# Patient Record
Sex: Female | Born: 1975 | Race: White | Hispanic: No | Marital: Married | Smoking: Current every day smoker
Health system: Southern US, Community
[De-identification: ages and names within clinical notes are randomized; demographics above are authoritative.]

## PROBLEM LIST (undated history)

## (undated) DIAGNOSIS — I639 Cerebral infarction, unspecified: Secondary | ICD-10-CM

## (undated) DIAGNOSIS — I10 Essential (primary) hypertension: Secondary | ICD-10-CM

## (undated) DIAGNOSIS — N809 Endometriosis, unspecified: Secondary | ICD-10-CM

## (undated) DIAGNOSIS — N83209 Unspecified ovarian cyst, unspecified side: Secondary | ICD-10-CM

## (undated) HISTORY — PX: TONSILLECTOMY: SUR1361

## (undated) HISTORY — PX: CHOLECYSTECTOMY: SHX55

---

## 2007-12-21 DIAGNOSIS — I639 Cerebral infarction, unspecified: Secondary | ICD-10-CM

## 2007-12-21 HISTORY — DX: Cerebral infarction, unspecified: I63.9

## 2008-12-20 HISTORY — PX: TUBAL LIGATION: SHX77

## 2013-12-20 HISTORY — PX: BREAST CYST ASPIRATION: SHX578

## 2021-09-06 ENCOUNTER — Other Ambulatory Visit: Payer: Self-pay

## 2021-09-06 DIAGNOSIS — N644 Mastodynia: Secondary | ICD-10-CM | POA: Insufficient documentation

## 2021-09-06 DIAGNOSIS — Z5321 Procedure and treatment not carried out due to patient leaving prior to being seen by health care provider: Secondary | ICD-10-CM | POA: Insufficient documentation

## 2021-09-06 LAB — CBC
HCT: 41.6 % (ref 36.0–46.0)
Hemoglobin: 14.5 g/dL (ref 12.0–15.0)
MCH: 31.2 pg (ref 26.0–34.0)
MCHC: 34.9 g/dL (ref 30.0–36.0)
MCV: 89.5 fL (ref 80.0–100.0)
Platelets: 291 10*3/uL (ref 150–400)
RBC: 4.65 MIL/uL (ref 3.87–5.11)
RDW: 12.1 % (ref 11.5–15.5)
WBC: 8.9 10*3/uL (ref 4.0–10.5)
nRBC: 0 % (ref 0.0–0.2)

## 2021-09-06 LAB — COMPREHENSIVE METABOLIC PANEL
ALT: 16 U/L (ref 0–44)
AST: 16 U/L (ref 15–41)
Albumin: 4.1 g/dL (ref 3.5–5.0)
Alkaline Phosphatase: 39 U/L (ref 38–126)
Anion gap: 8 (ref 5–15)
BUN: 13 mg/dL (ref 6–20)
CO2: 25 mmol/L (ref 22–32)
Calcium: 9 mg/dL (ref 8.9–10.3)
Chloride: 104 mmol/L (ref 98–111)
Creatinine, Ser: 0.73 mg/dL (ref 0.44–1.00)
GFR, Estimated: >60 mL/min (ref >60)
Glucose, Bld: 95 mg/dL (ref 70–99)
Potassium: 3.3 mmol/L — ABNORMAL LOW (ref 3.5–5.1)
Sodium: 137 mmol/L (ref 135–145)
Total Bilirubin: 0.6 mg/dL (ref 0.3–1.2)
Total Protein: 6.7 g/dL (ref 6.5–8.1)

## 2021-09-06 LAB — LIPASE, BLOOD: Lipase: 37 U/L (ref 11–51)

## 2021-09-06 NOTE — ED Triage Notes (Signed)
Per pt has been having right sided chest pain upper abd pain around to ribs "for awhile now". Pt states has been nauseated, but denies fever. Pt states has been shob, but also states has asthma. Pt appears in no acute distress, has no gallbladder.

## 2021-09-06 NOTE — ED Triage Notes (Signed)
First RN Note: Pt to ED via [facility] with c/o R side pain that starts under R breast. Per EMS EKG unremarkable, VSS WNL.   74HR 99% RA 125/76

## 2021-09-07 ENCOUNTER — Emergency Department: Payer: Self-pay

## 2021-09-07 ENCOUNTER — Emergency Department
Admission: EM | Admit: 2021-09-07 | Discharge: 2021-09-07 | Disposition: A | Discharge status: Home / Self Care | Payer: Self-pay | Attending: Emergency Medicine | Admitting: Emergency Medicine

## 2021-09-07 ENCOUNTER — Emergency Department
Admission: EM | Admit: 2021-09-07 | Discharge: 2021-09-07 | Disposition: A | Discharge status: Home / Self Care | Payer: Self-pay | Attending: Student in an Organized Health Care Education/Training Program | Admitting: Student in an Organized Health Care Education/Training Program

## 2021-09-07 DIAGNOSIS — M25569 Pain in unspecified knee: Secondary | ICD-10-CM | POA: Insufficient documentation

## 2021-09-07 DIAGNOSIS — Z91010 Allergy to peanuts: Secondary | ICD-10-CM | POA: Diagnosis not present

## 2021-09-07 DIAGNOSIS — R0789 Other chest pain: Secondary | ICD-10-CM | POA: Insufficient documentation

## 2021-09-07 DIAGNOSIS — R110 Nausea: Secondary | ICD-10-CM | POA: Insufficient documentation

## 2021-09-07 DIAGNOSIS — R1011 Right upper quadrant pain: Secondary | ICD-10-CM | POA: Insufficient documentation

## 2021-09-07 HISTORY — DX: Cerebral infarction, unspecified: I63.9

## 2021-09-07 LAB — CBC
HCT: 43.3 % (ref 36.0–46.0)
Hemoglobin: 15.3 g/dL — ABNORMAL HIGH (ref 12.0–15.0)
MCH: 31.9 pg (ref 26.0–34.0)
MCHC: 35.3 g/dL (ref 30.0–36.0)
MCV: 90.2 fL (ref 80.0–100.0)
Platelets: 285 10*3/uL (ref 150–400)
RBC: 4.8 MIL/uL (ref 3.87–5.11)
RDW: 12.2 % (ref 11.5–15.5)
WBC: 7.6 10*3/uL (ref 4.0–10.5)
nRBC: 0 % (ref 0.0–0.2)

## 2021-09-07 LAB — LIPASE, BLOOD: Lipase: 36 U/L (ref 11–51)

## 2021-09-07 LAB — COMPREHENSIVE METABOLIC PANEL
ALT: 17 U/L (ref 0–44)
AST: 17 U/L (ref 15–41)
Albumin: 4.1 g/dL (ref 3.5–5.0)
Alkaline Phosphatase: 35 U/L — ABNORMAL LOW (ref 38–126)
Anion gap: 7 (ref 5–15)
BUN: 12 mg/dL (ref 6–20)
CO2: 24 mmol/L (ref 22–32)
Calcium: 9.3 mg/dL (ref 8.9–10.3)
Chloride: 106 mmol/L (ref 98–111)
Creatinine, Ser: 0.74 mg/dL (ref 0.44–1.00)
GFR, Estimated: >60 mL/min (ref >60)
Glucose, Bld: 86 mg/dL (ref 70–99)
Potassium: 3.8 mmol/L (ref 3.5–5.1)
Sodium: 137 mmol/L (ref 135–145)
Total Bilirubin: 0.8 mg/dL (ref 0.3–1.2)
Total Protein: 6.9 g/dL (ref 6.5–8.1)

## 2021-09-07 MED ORDER — ACETAMINOPHEN 500 MG PO TABS
1000.0000 mg | ORAL_TABLET | Freq: Once | ORAL | Status: AC
  Administered 2021-09-07: 1000 mg via ORAL
  Filled 2021-09-07: qty 2

## 2021-09-07 MED ORDER — KETOROLAC TROMETHAMINE 30 MG/ML IJ SOLN
30.0000 mg | Freq: Once | INTRAMUSCULAR | Status: AC
  Administered 2021-09-07: 30 mg via INTRAMUSCULAR
  Filled 2021-09-07: qty 1

## 2021-09-07 MED ORDER — LIDOCAINE 5 % EX PTCH: 1.0000 | MEDICATED_PATCH | Freq: Two times a day (BID) | CUTANEOUS | 0 refills | Status: DC

## 2021-09-07 MED ORDER — LIDOCAINE 5 % EX PTCH
1.0000 | MEDICATED_PATCH | Freq: Once | CUTANEOUS | Status: DC
  Administered 2021-09-07: 1 via TRANSDERMAL
  Filled 2021-09-07: qty 1

## 2021-09-07 NOTE — ED Provider Notes (Signed)
[facility] Emergency Department Provider Note ____________________________________________   Event Date/Time   First MD Initiated Contact with Patient 09/07/21 1200     (approximate)  I have reviewed the triage vital signs and the nursing notes.  HISTORY  Chief Complaint Abdominal Pain and Knee Pain   HPI Brandi Ford is a 45 y.o. femalewho presents to the ED for evaluation of RUQ abdominal/right chest pain.  Chart review indicates obese patient.  She reports smoking history.  She is status post cholecystectomy.  She presents to the ED for the ration of right basilar chest/RUQ pain that has been progressively worsened over the past couple days.  Has not taken any medications prior to arrival.  Denies shortness of breath, cough, substernal chest pain, emesis, diarrhea.  Does report some associated nausea.  Denies dysuria.   Reports some associated nausea.   Past Medical History:  Diagnosis Date   Stroke [facility])     There are no problems to display for this patient.   Past Surgical History:  Procedure Laterality Date   CHOLECYSTECTOMY      Prior to Admission medications   Medication Sig Start Date End Date Taking? Authorizing Provider  lidocaine (LIDODERM) 5 % Place 1 patch onto the skin every 12 (twelve) hours. Remove & Discard patch within 12 hours or as directed by MD 09/07/21 09/07/22 Yes Vladimir Crofts, MD    Allergies Aspirin, Claritin [loratadine], Dilaudid [hydromorphone], Norco [hydrocodone-acetaminophen], and Peanut (diagnostic)  No family history on file.  Social History Social History   Substance Use Topics   Alcohol use: Not Currently   Drug use: Not Currently    Review of Systems  Constitutional: No fever/chills Eyes: No visual changes. ENT: No sore throat. Cardiovascular: Positive right basilar chest pain, atraumatic. Respiratory: Denies shortness of breath. Gastrointestinal: No abdominal pain.   no vomiting.  No  diarrhea.  No constipation. Positive for nausea Genitourinary: Negative for dysuria. Musculoskeletal: Negative for back pain. Skin: Negative for rash. Neurological: Negative for headaches, focal weakness or numbness.   ____________________________________________   PHYSICAL EXAM:  VITAL SIGNS: Vitals:   09/07/21 1145  BP: (!) 126/47  Pulse: 70  Resp: 16  Temp: 98.4 F (36.9 C)  SpO2: 96%    Constitutional: Alert and oriented. Well appearing and in no acute distress. Eyes: Conjunctivae are normal. PERRL. EOMI. Head: Atraumatic. Nose: No congestion/rhinnorhea. Mouth/Throat: Mucous membranes are moist.  Oropharynx non-erythematous. Neck: No stridor. No cervical spine tenderness to palpation. Cardiovascular: Normal rate, regular rhythm. Grossly normal heart sounds.  Good peripheral circulation. Respiratory: Normal respiratory effort.  No retractions. Lungs CTAB. Gastrointestinal: Soft , nondistended, nontender to palpation. No CVA tenderness. Benign abdomen including RUQ and epigastrium. Musculoskeletal: No lower extremity tenderness nor edema.  No joint effusions. No signs of acute trauma. Most tender to the inferior portion of the right anterior and lateral chest wall and rib cage.  No overlying skin changes. Neurologic:  Normal speech and language. No gross focal neurologic deficits are appreciated. No gait instability noted. Skin:  Skin is warm, dry and intact. No rash noted. Psychiatric: Mood and affect are normal. Speech and behavior are normal. ____________________________________________   LABS (all labs ordered are listed, but only abnormal results are displayed)  Labs Reviewed  COMPREHENSIVE METABOLIC PANEL - Abnormal; Notable for the following components:      Result Value   Alkaline Phosphatase 35 (*)    All other components within normal limits  CBC - Abnormal; Notable for the following components:  Hemoglobin 15.3 (*)    All other components within normal  limits  LIPASE, BLOOD  URINALYSIS, COMPLETE (UACMP) WITH MICROSCOPIC   ____________________________________________  12 Lead EKG  Sinus rhythm with a rate of 66 bpm.  Rightward axis, normal intervals.  No evidence of acute ischemia. ____________________________________________  RADIOLOGY  ED MD interpretation: 2 view CXR reviewed by me without evidence of acute cardiopulmonary pathology.  Official radiology report(s): DG Chest 2 View  Result Date: 09/07/2021 CLINICAL DATA:  RIGHT basilar chest pain EXAM: CHEST - 2 VIEW COMPARISON:  None. FINDINGS: Normal mediastinum and cardiac silhouette. Normal pulmonary vasculature. No evidence of effusion, infiltrate, or pneumothorax. No acute bony abnormality. IMPRESSION: No acute cardiopulmonary process. Electronically Signed   By: Suzy Bouchard M.D.   On: 09/07/2021 13:11    ____________________________________________   PROCEDURES and INTERVENTIONS  Procedure(s) performed (including Critical Care):  Procedures  Medications  lidocaine (LIDODERM) 5 % 1 patch (1 patch Transdermal Patch Applied 09/07/21 1224)  ketorolac (TORADOL) 30 MG/ML injection 30 mg (30 mg Intramuscular Given 09/07/21 1224)  acetaminophen (TYLENOL) tablet 1,000 mg (1,000 mg Oral Given 09/07/21 1223)    ____________________________________________   MDM / ED COURSE   45 year old female presents to the ED with right-sided pain, likely MSK chest wall pain, and amenable to outpatient management.  Triaged as abdominal pain, but her tenderness is really to her right inferior lateral rib cage.  No abdominal tenderness and her abdomen is soft and benign beneath this.  CXR without infiltrate or PTX.  She is already s/p cholecystectomy.  Resolving symptoms after Toradol and lidocaine patch.  We will discharge with return precautions.  Clinical Course as of 09/07/21 1333  Mon Sep 07, 2021  1212 Discussed plan of care with pt and husband. In agreement [DS]  1331 Reassessed.   Resolving pain.  We discussed return precautions for the ED.  Requesting a work note [DS]    Clinical Course User Index [DS] Vladimir Crofts, MD    ____________________________________________   FINAL CLINICAL IMPRESSION(S) / ED DIAGNOSES  Final diagnoses:  Other chest pain  Chest wall pain     ED Discharge Orders          Ordered    lidocaine (LIDODERM) 5 %  Every 12 hours        09/07/21 1331             Brandi Ford   Note:  This document was prepared using Systems analyst and may include unintentional dictation errors.    Vladimir Crofts, MD 09/07/21 1335

## 2021-09-07 NOTE — ED Triage Notes (Signed)
Pt to ED for RUQ pain that started a few days ago. +nausea.  Also reports left knee pain that started today while standing.  LWBS yesterday

## 2021-09-07 NOTE — Discharge Instructions (Signed)
Please take Tylenol and ibuprofen/Advil for your pain.  It is safe to take them together, or to alternate them every few hours.  Take up to 1000mg  of Tylenol at a time, up to 4 times per day.  Do not take more than 4000 mg of Tylenol in 24 hours.  For ibuprofen, take 400-600 mg, 4-5 times per day.  Please use lidocaine patches and your site of pain.  Apply 1 patch at a time, leave on for 12 hours, then remove for 12 hours.  12 hours on, 12 hours off.  Do not apply more than 1 patch at a time.

## 2022-02-09 ENCOUNTER — Other Ambulatory Visit: Payer: Self-pay

## 2022-02-09 ENCOUNTER — Ambulatory Visit
Admission: RE | Admit: 2022-02-09 | Discharge: 2022-02-09 | Disposition: A | Discharge status: Home / Self Care | Payer: PRIVATE HEALTH INSURANCE | Source: Ambulatory Visit | Attending: Emergency Medicine | Admitting: Emergency Medicine

## 2022-02-09 VITALS — BP 115/51 | HR 77 | Temp 98.9°F

## 2022-02-09 DIAGNOSIS — H66006 Acute suppurative otitis media without spontaneous rupture of ear drum, recurrent, bilateral: Secondary | ICD-10-CM

## 2022-02-09 HISTORY — DX: Essential (primary) hypertension: I10

## 2022-02-09 MED ORDER — PREDNISONE 20 MG PO TABS: 60.0000 mg | ORAL_TABLET | Freq: Every day | ORAL | 0 refills | Status: AC

## 2022-02-09 MED ORDER — CEFDINIR 300 MG PO CAPS: 300.0000 mg | ORAL_CAPSULE | Freq: Two times a day (BID) | ORAL | 0 refills | Status: AC

## 2022-02-09 NOTE — Discharge Instructions (Addendum)
Take the Cefdinir twice daily for 10 days with food for treatment of your ear infection.  Take an over-the-counter probiotic 1 hour after each dose of antibiotic to prevent diarrhea.  Take the Prednisone starting tomorrow to help with inflammation and pain.  Use over-the-counter Tylenol as needed for pain or fever.  Place a hot water bottle, or heating pad, underneath your pillowcase at night to help dilate up your ear and aid in pain relief as well as resolution of the infection.  You need to follow-up with ENT. You can try calling [facility] or [facility] in [facility]  Return for reevaluation for any new or worsening symptoms.

## 2022-02-09 NOTE — ED Triage Notes (Signed)
Patient presents to Urgent Care with complaints of right ear pain discomfort since New Years eve. She states her left ear has also been giving her problems since feb 7th.  She states she has been treated twice for ear infections. Pt reports ENT cannot see her until April. Finished her last dose of antibx last week. Treating pain with Tylenol, Ibuprofen, and sudafed.

## 2022-02-09 NOTE — ED Provider Notes (Signed)
[facility]    CSN: [id number] Arrival date & time: 02/09/22  1353      History   Chief Complaint Chief Complaint  Patient presents with   Otalgia   Appointment    1400    HPI Brandi Ford is a 46 y.o. female.   HPI  46 year old female here for evaluation of ear pain.  Patient reports that she has been experiencing pain in her right ear since New Year's Eve and then developed pain in her left ear on February 7.  [facility] and has been treated with 2 rounds of antibiotics without resolution of her symptoms.  She has also been using Tylenol, ibuprofen, Flonase, and Sudafed without improvement.  Now she endorses ringing in her ears and states that she cannot pop her ears.  She endorses decreased hearing loss and some intermittent dizziness.  She denies any drainage from the ears.  She is running intermittent fevers with a Tmax of 100.  Past Medical History:  Diagnosis Date   High blood pressure    Stroke ([place])     There are no problems to display for this patient.   Past Surgical History:  Procedure Laterality Date   CESAREAN SECTION     2002 and 2006   CHOLECYSTECTOMY     TONSILLECTOMY      OB History   No obstetric history on file.      Home Medications    Prior to Admission medications   Medication Sig Start Date End Date Taking? Authorizing Provider  cefdinir (OMNICEF) 300 MG capsule Take 1 capsule (300 mg total) by mouth 2 (two) times daily for 10 days. 02/09/22 02/19/22 Yes Margarette Canada, NP  predniSONE (DELTASONE) 20 MG tablet Take 3 tablets (60 mg total) by mouth daily with breakfast for 5 days. 3 tablets daily for 5 days. 02/09/22 02/14/22 Yes Margarette Canada, NP  lidocaine (LIDODERM) 5 % Place 1 patch onto the skin every 12 (twelve) hours. Remove & Discard patch within 12 hours or as directed by MD 09/07/21 09/07/22  Vladimir Crofts, MD    Family History History reviewed. No pertinent family history.  Social History Social  History   Tobacco Use   Smoking status: Every Day    Types: Cigarettes   Smokeless tobacco: Never  Vaping Use   Vaping Use: Never used  Substance Use Topics   Alcohol use: Yes    Comment: occasionally   Drug use: Never     Allergies   Aspirin, Claritin [loratadine], Dilaudid [hydromorphone], Norco [hydrocodone-acetaminophen], and Peanut (diagnostic)   Review of Systems Review of Systems  Constitutional:  Positive for fever.  HENT:  Positive for ear pain, hearing loss and tinnitus. Negative for ear discharge.   Hematological: Negative.   Psychiatric/Behavioral: Negative.      Physical Exam Triage Vital Signs ED Triage Vitals  Enc Vitals Group     BP 02/09/22 1410 (!) 115/51     Pulse Rate 02/09/22 1410 77     Resp --      Temp 02/09/22 1410 98.9 F (37.2 C)     Temp Source 02/09/22 1410 Oral     SpO2 02/09/22 1410 99 %     Weight --      Height --      Head Circumference --      Peak Flow --      Pain Score 02/09/22 1406 6     Pain Loc --  Pain Edu? --      Excl. in [place]? --    No data found.  Updated Vital Signs BP (!) 115/51 (BP Location: Left Arm)    Pulse 77    Temp 98.9 F (37.2 C) (Oral)    LMP 02/06/2022    SpO2 99%   Visual Acuity Right Eye Distance:   Left Eye Distance:   Bilateral Distance:    Right Eye Near:   Left Eye Near:    Bilateral Near:     Physical Exam Vitals and nursing note reviewed.  Constitutional:      Appearance: Normal appearance. She is not ill-appearing.  HENT:     Head: Normocephalic and atraumatic.     Right Ear: Ear canal and external ear normal.     Left Ear: Ear canal and external ear normal.  Skin:    General: Skin is warm and dry.     Capillary Refill: Capillary refill takes less than 2 seconds.     Findings: No erythema or rash.  Neurological:     General: No focal deficit present.     Mental Status: She is alert and oriented to person, place, and time.  Psychiatric:        Mood and Affect: Mood  normal.        Behavior: Behavior normal.        Thought Content: Thought content normal.        Judgment: Judgment normal.     UC Treatments / Results  Labs (all labs ordered are listed, but only abnormal results are displayed) Labs Reviewed - No data to display  EKG   Radiology No results found.  Procedures Procedures (including critical care time)  Medications Ordered in UC Medications - No data to display  Initial Impression / Assessment and Plan / UC Course  I have reviewed the triage vital signs and the nursing notes.  Pertinent labs & imaging results that were available during my care of the patient were reviewed by me and considered in my medical decision making (see chart for details).  Patient is a very pleasant, nontoxic-appearing 64 old female here for evaluation of continued ear complaints that been going on since New Year's Eve.  Patient's been through 2 different rounds of antibiotics to treat ear infection as well as using Sudafed and Flonase without any improvement of symptoms.  She states she is now experiencing ringing in her ears and intermittent dizziness.  She also indicates that she has had decreased hearing.  She [facility] and they advised her that if she did not have resolution of symptoms with the second round of antibiotics that she would need to see ENT.  She called [facility] and they are not able to see her until April.  She came here for reevaluation.  On exam patient has significant serous effusions behind both tympanic membranes and both TMs are erythematous and injected.  The external auditory canals are clear.  Patient also has tenderness with palpation of the eustachian tubes externally.  She is unable to equalize her ears.  I have advised her that she needs to see ENT as she has had infections for too long and the effusions may very well have solidified which could damage her hearing.  There is also concern of  infection eroding the thin bone between her ear canal and her brain causing potential serious infection.  I will place the patient on cefdinir and prednisone  and have advised her to make an appointment with [facility] or [facility] for evaluation and definitive treatment.  If she is not able to get into see either practice within the next week she should go to the ER to be evaluated by the ENT on-call.  I also advised her that if she starts running fevers that she should go to the ER sooner.   Final Clinical Impressions(s) / UC Diagnoses   Final diagnoses:  Recurrent acute suppurative otitis media without spontaneous rupture of tympanic membrane of both sides     Discharge Instructions      Take the Cefdinir twice daily for 10 days with food for treatment of your ear infection.  Take an over-the-counter probiotic 1 hour after each dose of antibiotic to prevent diarrhea.  Take the Prednisone starting tomorrow to help with inflammation and pain.  Use over-the-counter Tylenol as needed for pain or fever.  Place a hot water bottle, or heating pad, underneath your pillowcase at night to help dilate up your ear and aid in pain relief as well as resolution of the infection.  You need to follow-up with ENT. You can try calling [facility] or [facility] in [facility]  Return for reevaluation for any new or worsening symptoms.      ED Prescriptions     Medication Sig Dispense Auth. Provider   cefdinir (OMNICEF) 300 MG capsule Take 1 capsule (300 mg total) by mouth 2 (two) times daily for 10 days. 20 capsule Margarette Canada, NP   predniSONE (DELTASONE) 20 MG tablet Take 3 tablets (60 mg total) by mouth daily with breakfast for 5 days. 3 tablets daily for 5 days. 15 tablet Margarette Canada, NP      PDMP not reviewed this encounter.   Margarette Canada, NP 02/09/22 1620

## 2022-04-05 ENCOUNTER — Other Ambulatory Visit: Payer: Self-pay

## 2022-04-05 ENCOUNTER — Emergency Department: Payer: PRIVATE HEALTH INSURANCE

## 2022-04-05 ENCOUNTER — Emergency Department
Admission: EM | Admit: 2022-04-05 | Discharge: 2022-04-05 | Disposition: A | Discharge status: Home / Self Care | Payer: PRIVATE HEALTH INSURANCE | Attending: Emergency Medicine | Admitting: Emergency Medicine

## 2022-04-05 ENCOUNTER — Encounter: Payer: Self-pay | Admitting: Intensive Care

## 2022-04-05 DIAGNOSIS — R1030 Lower abdominal pain, unspecified: Secondary | ICD-10-CM

## 2022-04-05 DIAGNOSIS — R1031 Right lower quadrant pain: Secondary | ICD-10-CM | POA: Insufficient documentation

## 2022-04-05 HISTORY — DX: Endometriosis, unspecified: N80.9

## 2022-04-05 LAB — CBC
HCT: 46.4 % — ABNORMAL HIGH (ref 36.0–46.0)
Hemoglobin: 15.5 g/dL — ABNORMAL HIGH (ref 12.0–15.0)
MCH: 30.5 pg (ref 26.0–34.0)
MCHC: 33.4 g/dL (ref 30.0–36.0)
MCV: 91.3 fL (ref 80.0–100.0)
Platelets: 356 10*3/uL (ref 150–400)
RBC: 5.08 MIL/uL (ref 3.87–5.11)
RDW: 13 % (ref 11.5–15.5)
WBC: 10.1 10*3/uL (ref 4.0–10.5)
nRBC: 0 % (ref 0.0–0.2)

## 2022-04-05 LAB — LIPASE, BLOOD: Lipase: 41 U/L (ref 11–51)

## 2022-04-05 LAB — COMPREHENSIVE METABOLIC PANEL
ALT: 23 U/L (ref 0–44)
AST: 19 U/L (ref 15–41)
Albumin: 4.2 g/dL (ref 3.5–5.0)
Alkaline Phosphatase: 38 U/L (ref 38–126)
Anion gap: 10 (ref 5–15)
BUN: 12 mg/dL (ref 6–20)
CO2: 23 mmol/L (ref 22–32)
Calcium: 9.6 mg/dL (ref 8.9–10.3)
Chloride: 105 mmol/L (ref 98–111)
Creatinine, Ser: 0.75 mg/dL (ref 0.44–1.00)
GFR, Estimated: >60 mL/min (ref >60)
Glucose, Bld: 90 mg/dL (ref 70–99)
Potassium: 4 mmol/L (ref 3.5–5.1)
Sodium: 138 mmol/L (ref 135–145)
Total Bilirubin: 0.6 mg/dL (ref 0.3–1.2)
Total Protein: 7 g/dL (ref 6.5–8.1)

## 2022-04-05 LAB — URINALYSIS, ROUTINE W REFLEX MICROSCOPIC
Bilirubin Urine: NEGATIVE
Glucose, UA: NEGATIVE mg/dL
Hgb urine dipstick: NEGATIVE
Ketones, ur: NEGATIVE mg/dL
Leukocytes,Ua: NEGATIVE
Nitrite: NEGATIVE
Protein, ur: NEGATIVE mg/dL
Specific Gravity, Urine: 1.016 (ref 1.005–1.030)
pH: 5 (ref 5.0–8.0)

## 2022-04-05 LAB — POC URINE PREG, ED: Preg Test, Ur: NEGATIVE

## 2022-04-05 MED ORDER — SODIUM CHLORIDE 0.9 % IV BOLUS
1000.0000 mL | Freq: Once | INTRAVENOUS | Status: AC
  Administered 2022-04-05: 1000 mL via INTRAVENOUS

## 2022-04-05 MED ORDER — ONDANSETRON HCL 4 MG/2ML IJ SOLN
4.0000 mg | Freq: Once | INTRAMUSCULAR | Status: AC
  Administered 2022-04-05: 4 mg via INTRAVENOUS
  Filled 2022-04-05: qty 2

## 2022-04-05 MED ORDER — MORPHINE SULFATE (PF) 4 MG/ML IV SOLN
4.0000 mg | Freq: Once | INTRAVENOUS | Status: AC
  Administered 2022-04-05: 4 mg via INTRAVENOUS
  Filled 2022-04-05: qty 1

## 2022-04-05 MED ORDER — MELOXICAM 15 MG PO TABS: 15.0000 mg | ORAL_TABLET | Freq: Every day | ORAL | 0 refills | Status: DC

## 2022-04-05 MED ORDER — KETOROLAC TROMETHAMINE 30 MG/ML IJ SOLN
30.0000 mg | Freq: Once | INTRAMUSCULAR | Status: AC
  Administered 2022-04-05: 30 mg via INTRAVENOUS
  Filled 2022-04-05: qty 1

## 2022-04-05 MED ORDER — DICYCLOMINE HCL 10 MG PO CAPS: 10.0000 mg | ORAL_CAPSULE | Freq: Three times a day (TID) | ORAL | 0 refills | Status: DC

## 2022-04-05 MED ORDER — IOHEXOL 300 MG/ML  SOLN
100.0000 mL | Freq: Once | INTRAMUSCULAR | Status: AC | PRN
  Administered 2022-04-05: 100 mL via INTRAVENOUS
  Filled 2022-04-05: qty 100

## 2022-04-05 MED ORDER — ONDANSETRON 4 MG PO TBDP: 4.0000 mg | ORAL_TABLET | Freq: Three times a day (TID) | ORAL | 0 refills | Status: DC | PRN

## 2022-04-05 NOTE — ED Triage Notes (Signed)
Patient c/o sharp pain that starts mid abdomen and radiates up to bottom, center of breasts with nausea that started this AM.  ?

## 2022-04-05 NOTE — ED Provider Notes (Signed)
? ?[facility] ?Provider Note ? ?Patient Contact: 3:27 PM (approximate) ? ? ?History  ? ?Abdominal Pain ? ? ?HPI ? ?Brandi Ford is a 46 y.o. female who presents the emergency department complaining of sharp mid/lower abdominal pain that radiates up into the upper portion of her abdomen.  Patient states that she has had pain since last night.  She is unsure whether this is something that she ate, but she states that everybody you ate at the same restaurant does not have similar symptoms.  She is not having any diarrhea or constipation currently.  She has been nauseated but no emesis.  She has had multiple abdominal surgeries mostly for endometriosis.  These are all laparoscopic but she states that she has a a lot of "scar tissue" in her lower abdomen.  No urinary changes, fevers, chills, URI symptoms.  Patient denies any chest pain or shortness of breath.  Pain is described as sharp, more on the right side though the pain starts in the midline.  Patient still has her appendix but has had a cholecystectomy.  Still has ovaries and uterus at this time.  Denies chance for pregnancy. ?  ? ? ?Physical Exam  ? ?Triage Vital Signs: ?ED Triage Vitals  ?Enc Vitals Group  ?   BP 04/05/22 1421 129/79  ?   Pulse Rate 04/05/22 1421 73  ?   Resp 04/05/22 1421 18  ?   Temp 04/05/22 1421 98.8 ?F (37.1 ?C)  ?   Temp Source 04/05/22 1421 Oral  ?   SpO2 04/05/22 1421 96 %  ?   Weight 04/05/22 1422 240 lb (108.9 kg)  ?   Height 04/05/22 1422 '5\' 9"'$  (1.753 m)  ?   Head Circumference --   ?   Peak Flow --   ?   Pain Score 04/05/22 1421 7  ?   Pain Loc --   ?   Pain Edu? --   ?   Excl. in [place]? --   ? ? ?Most recent vital signs: ?Vitals:  ? 04/05/22 1757 04/05/22 1758  ?BP: 112/71 129/70  ?Pulse: 63 70  ?Resp: 16 18  ?Temp:    ?SpO2: 100% 96%  ? ? ? ?General: Alert and in no acute distress.  ?Cardiovascular:  Good peripheral perfusion ?Respiratory: Normal respiratory effort without tachypnea or retractions. Lungs CTAB.   ?Gastrointestinal: Bowel sounds ?4 quadrants.  Soft to palpation all quadrants, tender starting in the midline extending into the right lower quadrant.  Positive guarding but no rigidity. No palpable masses. No distention. No CVA tenderness. ?Musculoskeletal: Full range of motion to all extremities.  ?Neurologic:  No gross focal neurologic deficits are appreciated.  ?Skin:   No rash noted ?Other: ? ? ?ED Results / Procedures / Treatments  ? ?Labs ?(all labs ordered are listed, but only abnormal results are displayed) ?Labs Reviewed  ?CBC - Abnormal; Notable for the following components:  ?    Result Value  ? Hemoglobin 15.5 (*)   ? HCT 46.4 (*)   ? All other components within normal limits  ?URINALYSIS, ROUTINE W REFLEX MICROSCOPIC - Abnormal; Notable for the following components:  ? Color, Urine YELLOW (*)   ? APPearance HAZY (*)   ? All other components within normal limits  ?LIPASE, BLOOD  ?COMPREHENSIVE METABOLIC PANEL  ?POC URINE PREG, ED  ? ? ? ?EKG ? ?ED ECG REPORT ?ICharline Bills [place] Gamero,  personally viewed and interpreted this ECG. ? ? Date: 0/17/2023 ?  EKG Time: 1426 hrs. ? Rate: 69 bpm ? Rhythm: unchanged from previous tracings, normal sinus rhythm, compared to previous EKG from 09/08/2021 no significant changes ? Axis: Normal axis ? Intervals:none ? ST&T Change: No ST elevation or depression noted ? ?Normal sinus rhythm, no STEMI.  No significant changes from previous EKG from 09/08/2021 ? ? ? ?RADIOLOGY ? ?I personally viewed and evaluated these images as part of my medical decision making, as well as reviewing the written report by the radiologist. ? ?ED Provider Interpretation: Incidental findings of what appears to be cyst in the liver versus hemangioma, bilateral renal cysts, ovarian cyst.  Given the patient's symptoms do not feel that this likely explains her pain.  Patient had ultrasound which revealed no acute findings consistent with torsion.  There is again visualized cyst on the left kidney  the patient is mostly tender on the right. ? ?CT ABDOMEN PELVIS W CONTRAST ? ?Result Date: 04/05/2022 ?CLINICAL DATA:  Pain right lower quadrant EXAM: CT ABDOMEN AND PELVIS WITH CONTRAST TECHNIQUE: Multidetector CT imaging of the abdomen and pelvis was performed using the standard protocol following bolus administration of intravenous contrast. RADIATION DOSE REDUCTION: This exam was performed according to the departmental dose-optimization program which includes automated exposure control, adjustment of the mA and/or kV according to patient size and/or use of iterative reconstruction technique. CONTRAST:  167m OMNIPAQUE IOHEXOL 300 MG/ML  SOLN COMPARISON:  None. FINDINGS: Lower chest: Small linear densities in the lower lung fields may suggest minimal scarring or minimal subsegmental atelectasis. Hepatobiliary: Liver measures 16.8 cm in length. In image 29 of series 2, there is 6 mm low-density in the right lobe, possibly a cyst or hemangioma. There is no dilation of bile ducts. Surgical clips are seen in gallbladder fossa. Pancreas: No focal abnormality is seen. Spleen: Unremarkable. Adrenals/Urinary Tract: Adrenals are not enlarged. There is no hydronephrosis. There are no renal or ureteral stones. There is 2.5 cm cyst in the upper pole of left kidney. In the image 31 of series 2, there is 7 mm low-density structure which is too small to optimally characterize. Density measurements appear higher than usual for simple cyst. In image 37 there is 7 mm low-density lesion, possibly a cyst. Stomach/Bowel: Stomach is not distended. Small bowel loops are not dilated. Appendix is not dilated. Scattered diverticula are seen in the colon without signs of focal diverticulitis. Vascular/Lymphatic: Unremarkable. Reproductive: There is 2.1 cm low-density structure in the left adnexa possibly a dominant follicle. There is mild lobulation in the margin of the uterus, possibly small fibroids. Other: There is no ascites or  pneumoperitoneum. Small umbilical hernia containing fat is seen. Musculoskeletal: Degenerative changes are noted in the lumbar spine, particularly severe at L5-S1 level with encroachment of neural foramina. IMPRESSION: There is no evidence of intestinal obstruction or pneumoperitoneum. Appendix is not dilated. There is no hydronephrosis. There is 6 mm low-density structure in the right lobe of liver, possibly a cyst or hemangioma. Bilateral renal cysts. There is 7 mm low-density structure in the posterior midportion of right kidney with density measurements higher than usual for simple cyst. Follow-up renal sonogram may be considered. Scattered diverticula are seen in colon without signs of focal diverticulitis. 2.1 cm low-density structure in the left adnexa may suggest ovarian follicle. Possible small uterine fibroids. Lumbar spondylosis at L5-S1 level with encroachment of neural foramina. Other findings as described in the body of the report. Electronically Signed   By: PElmer PickerM.D.   On: 04/05/2022 16:51  ? ?  US PELVIC COMPLETE W TRANSVAGINAL AND TORSION R/O ? ?Result Date: 04/05/2022 ?CLINICAL DATA:  Right lower quadrant pain since this morning EXAM: TRANSABDOMINAL AND TRANSVAGINAL ULTRASOUND OF PELVIS DOPPLER ULTRASOUND OF OVARIES TECHNIQUE: Both transabdominal and transvaginal ultrasound examinations of the pelvis were performed. Transabdominal technique was performed for global imaging of the pelvis including uterus, ovaries, adnexal regions, and pelvic cul-de-sac. It was necessary to proceed with endovaginal exam following the transabdominal exam to visualize the endometrium and adnexal structures. Color and duplex Doppler ultrasound was utilized to evaluate blood flow to the ovaries. COMPARISON:  04/05/2022 FINDINGS: Uterus Measurements: 9.2 x 4.9 x 4.9 cm = volume: 115.1 mL. No fibroids or other mass visualized. Endometrium Thickness: 6 mm.  No focal abnormality visualized. Right ovary  Measurements: 2.7 x 1.8 x 2.1 cm = volume: 5.5 mL. Normal appearance/no adnexal mass. Left ovary Measurements: 3.9 x 2.1 x 2.4 cm = volume: 10.4 mL. Dominant follicle within the left ovary unchanged since CT. Otherwis

## 2022-05-13 ENCOUNTER — Other Ambulatory Visit: Payer: Self-pay

## 2022-05-13 ENCOUNTER — Encounter: Payer: Self-pay | Admitting: Nurse Practitioner

## 2022-05-13 ENCOUNTER — Ambulatory Visit: Payer: PRIVATE HEALTH INSURANCE | Admitting: Nurse Practitioner

## 2022-05-13 VITALS — BP 124/76 | HR 93 | Temp 98.2°F | Resp 16 | Ht 73.0 in | Wt 248.5 lb

## 2022-05-13 DIAGNOSIS — D219 Benign neoplasm of connective and other soft tissue, unspecified: Secondary | ICD-10-CM

## 2022-05-13 DIAGNOSIS — Z1159 Encounter for screening for other viral diseases: Secondary | ICD-10-CM

## 2022-05-13 DIAGNOSIS — Z13220 Encounter for screening for lipoid disorders: Secondary | ICD-10-CM

## 2022-05-13 DIAGNOSIS — I10 Essential (primary) hypertension: Secondary | ICD-10-CM | POA: Diagnosis not present

## 2022-05-13 DIAGNOSIS — N809 Endometriosis, unspecified: Secondary | ICD-10-CM

## 2022-05-13 DIAGNOSIS — Z1231 Encounter for screening mammogram for malignant neoplasm of breast: Secondary | ICD-10-CM

## 2022-05-13 DIAGNOSIS — J4520 Mild intermittent asthma, uncomplicated: Secondary | ICD-10-CM

## 2022-05-13 DIAGNOSIS — J449 Chronic obstructive pulmonary disease, unspecified: Secondary | ICD-10-CM

## 2022-05-13 DIAGNOSIS — Z131 Encounter for screening for diabetes mellitus: Secondary | ICD-10-CM

## 2022-05-13 DIAGNOSIS — F17219 Nicotine dependence, cigarettes, with unspecified nicotine-induced disorders: Secondary | ICD-10-CM

## 2022-05-13 DIAGNOSIS — Z114 Encounter for screening for human immunodeficiency virus [HIV]: Secondary | ICD-10-CM

## 2022-05-13 DIAGNOSIS — T7840XD Allergy, unspecified, subsequent encounter: Secondary | ICD-10-CM

## 2022-05-13 DIAGNOSIS — Z889 Allergy status to unspecified drugs, medicaments and biological substances status: Secondary | ICD-10-CM

## 2022-05-13 DIAGNOSIS — Z1211 Encounter for screening for malignant neoplasm of colon: Secondary | ICD-10-CM

## 2022-05-13 DIAGNOSIS — Z8673 Personal history of transient ischemic attack (TIA), and cerebral infarction without residual deficits: Secondary | ICD-10-CM | POA: Diagnosis not present

## 2022-05-13 NOTE — Assessment & Plan Note (Signed)
She is a current smoker she is smoking about 7 cigarettes a day.  She says she is working on reducing her cigarette usage.

## 2022-05-13 NOTE — Assessment & Plan Note (Signed)
Patient reports that she was having recurrent rashes and her tongue was swelling she saw an ear nose and throat doctor in April 2023 they did a blood test and told her that she was allergic to egg whites, cats, grass, dust, mold and yeast and that she need to see an allergist.  Referral for allergy placed.

## 2022-05-13 NOTE — Assessment & Plan Note (Signed)
She says her asthma is been pretty well controlled.  She has not had to use an albuterol inhaler in many years.

## 2022-05-13 NOTE — Assessment & Plan Note (Signed)
She says she was told a long time ago that she had COPD.  She is a current smoker she is smoking about 7 cigarettes a day.  She says she is working on reducing her cigarette usage.

## 2022-05-13 NOTE — Assessment & Plan Note (Signed)
Patient reports she has a history of endometriosis.  Patient would like to establish care with GYN.  Referral placed.

## 2022-05-13 NOTE — Progress Notes (Signed)
BP 124/76   Pulse 93   Temp 98.2 F (36.8 C) (Oral)   Resp 16   Ht '6\' 1"'$  (1.854 m)   Wt 248 lb 8 oz (112.7 kg)   LMP 05/08/2022   SpO2 98%   BMI 32.79 kg/m    Subjective:    Patient ID: Brandi Ford, female    DOB: [birth year], 46 y.o.   MRN: [id number]  HPI: Brandi Ford is a 46 y.o. female  Chief Complaint  Patient presents with   Establish Care   Establish care: She says that it was a long time ago, she cannot remember exactly.    Hypertension/history of cva: She says that she had a couple strokes in 2009 she lost feeling on her left side but is no longer having issues.  She says it was because that her blood pressure.  She was on medication but does not remember what she was on. She is not currently on medication and her blood pressure today is 124/76. She denies chest pain, shortness of breath, headaches or blurred vision.   Asthma/copd/ nicotine dependence: She says that she has had to use albuterol inhaler in the past but has not had to use one for a long time. She says that she was also diagnosed with copd was put on symbicort, she stopped taking that in 2010. She says she has not had any breathing problems since. She says she smokes 7 cigarettes a day. She says she is working on reducing her cigarette use.   Endometriosis/ fibroids: She says she has been having issues with endometriosis for years and fibroids. She says that they told her that if she continued to have problems she would need a hysterectomy. She denies any abnormal vaginal bleeding. She says that she needs to establish care with a GYN.  Referral placed.  Recurrent rashes/ allergies/ swelling of the tongue: She says that she was having rashes that were coming and going and she said her tongue would swell. She says she would take benadryl to help with symptoms. She says she saw ENT in April 2023 and was told she has a lot of allergies and wants to see an allergist. Will place referral for allergy. She says she  is allergic to egg white, cats, grass, dust, mold and yeast.   Relevant past medical, surgical, family and social history reviewed and updated as indicated. Interim medical history since our last visit reviewed. Allergies and medications reviewed and updated.  Review of Systems  Constitutional: Negative for fever or weight change.  Respiratory: Negative for cough and shortness of breath.   Cardiovascular: Negative for chest pain or palpitations.  Gastrointestinal: Negative for abdominal pain, no bowel changes.  Musculoskeletal: Negative for gait problem or joint swelling.  Skin: Negative for rash.  Neurological: Negative for dizziness or headache.  No other specific complaints in a complete review of systems (except as listed in HPI above).      Objective:    BP 124/76   Pulse 93   Temp 98.2 F (36.8 C) (Oral)   Resp 16   Ht '6\' 1"'$  (1.854 m)   Wt 248 lb 8 oz (112.7 kg)   LMP 05/08/2022   SpO2 98%   BMI 32.79 kg/m   Wt Readings from Last 3 Encounters:  05/13/22 248 lb 8 oz (112.7 kg)  04/05/22 240 lb (108.9 kg)  09/06/21 200 lb (90.7 kg)    Physical Exam  Constitutional: Patient appears well-developed and well-nourished. Obese  No  distress.  HEENT: head atraumatic, normocephalic, pupils equal and reactive to light, neck supple Cardiovascular: Normal rate, regular rhythm and normal heart sounds.  No murmur heard. No BLE edema. Pulmonary/Chest: Effort normal and breath sounds normal. No respiratory distress. Abdominal: Soft.  There is no tenderness. Psychiatric: Patient has a normal mood and affect. behavior is normal. Judgment and thought content normal.  Results for orders placed or performed during the hospital encounter of 04/05/22  Lipase, blood  Result Value Ref Range   Lipase 41 11 - 51 U/L  Comprehensive metabolic panel  Result Value Ref Range   Sodium 138 135 - 145 mmol/L   Potassium 4.0 3.5 - 5.1 mmol/L   Chloride 105 98 - 111 mmol/L   CO2 23 22 - 32 mmol/L    Glucose, Bld 90 70 - 99 mg/dL   BUN 12 6 - 20 mg/dL   Creatinine, Ser 0.75 0.44 - 1.00 mg/dL   Calcium 9.6 8.9 - 10.3 mg/dL   Total Protein 7.0 6.5 - 8.1 g/dL   Albumin 4.2 3.5 - 5.0 g/dL   AST 19 15 - 41 U/L   ALT 23 0 - 44 U/L   Alkaline Phosphatase 38 38 - 126 U/L   Total Bilirubin 0.6 0.3 - 1.2 mg/dL   GFR, Estimated >60 >60 mL/min   Anion gap 10 5 - 15  CBC  Result Value Ref Range   WBC 10.1 4.0 - 10.5 K/uL   RBC 5.08 3.87 - 5.11 MIL/uL   Hemoglobin 15.5 (H) 12.0 - 15.0 g/dL   HCT 46.4 (H) 36.0 - 46.0 %   MCV 91.3 80.0 - 100.0 fL   MCH 30.5 26.0 - 34.0 pg   MCHC 33.4 30.0 - 36.0 g/dL   RDW 13.0 11.5 - 15.5 %   Platelets 356 150 - 400 K/uL   nRBC 0.0 0.0 - 0.2 %  Urinalysis, Routine w reflex microscopic Urine, Clean Catch  Result Value Ref Range   Color, Urine YELLOW (A) YELLOW   APPearance HAZY (A) CLEAR   Specific Gravity, Urine 1.016 1.005 - 1.030   pH 5.0 5.0 - 8.0   Glucose, UA NEGATIVE NEGATIVE mg/dL   Hgb urine dipstick NEGATIVE NEGATIVE   Bilirubin Urine NEGATIVE NEGATIVE   Ketones, ur NEGATIVE NEGATIVE mg/dL   Protein, ur NEGATIVE NEGATIVE mg/dL   Nitrite NEGATIVE NEGATIVE   Leukocytes,Ua NEGATIVE NEGATIVE  POC urine preg, ED  Result Value Ref Range   Preg Test, Ur Negative Negative      Assessment & Plan:   Problem List Items Addressed This Visit       Cardiovascular and Mediastinum   Essential hypertension - Primary    Blood pressure is at goal 124/76.  Patient reports that she has not been on blood pressure medication for many years now.       Relevant Orders   CBC with Differential/Platelet   COMPLETE METABOLIC PANEL WITH GFR     Respiratory   Mild intermittent asthma    She says her asthma is been pretty well controlled.  She has not had to use an albuterol inhaler in many years.       Chronic obstructive pulmonary disease ([place])    She says she was told a long time ago that she had COPD.  She is a current smoker she is smoking about  7 cigarettes a day.  She says she is working on reducing her cigarette usage.         Nervous  and Auditory   Cigarette nicotine dependence with nicotine-induced disorder      She is a current smoker she is smoking about 7 cigarettes a day.  She says she is working on reducing her cigarette usage.        Other   History of CVA (cerebrovascular accident) without residual deficits    Patient reports she had a couple strokes in 2009.  She says she lost feeling on the left side of her body.  She says she has feeling now.  She denies any deficits.  Is not currently on any medications.  She was told that the reason she had the strokes was because of hypertension.  Her blood pressure is at goal right now 124/76       Fibroids    Patient reports she has a history of fibroids.  Patient would like to establish care with GYN.  Referral placed.       Relevant Orders   Ambulatory referral to Gynecology   H/O multiple allergies    Patient reports that she was having recurrent rashes and her tongue was swelling she saw an ear nose and throat doctor in April 2023 they did a blood test and told her that she was allergic to egg whites, cats, grass, dust, mold and yeast and that she need to see an allergist.  Referral for allergy placed.       Endometriosis    Patient reports she has a history of endometriosis.  Patient would like to establish care with GYN.  Referral placed.       Relevant Orders   Ambulatory referral to Gynecology   Other Visit Diagnoses     Screening for colon cancer       Relevant Orders   Ambulatory referral to Gastroenterology   Screening for diabetes mellitus       Relevant Orders   Hemoglobin A1c   Screening for cholesterol level       Relevant Orders   Lipid panel   Screening for HIV without presence of risk factors       Relevant Orders   HIV Antibody (routine testing w rflx)   Encounter for hepatitis C screening test for low risk patient       Relevant  Orders   Hepatitis C antibody   Encounter for screening mammogram for malignant neoplasm of breast       Relevant Orders   MM 3D SCREEN BREAST BILATERAL        Follow up plan: Return in about 6 months (around 11/13/2022) for follow up.

## 2022-05-13 NOTE — Assessment & Plan Note (Signed)
Blood pressure is at goal 124/76.  Patient reports that she has not been on blood pressure medication for many years now.

## 2022-05-13 NOTE — Assessment & Plan Note (Signed)
Patient reports she has a history of fibroids.  Patient would like to establish care with GYN.  Referral placed.

## 2022-05-13 NOTE — Assessment & Plan Note (Signed)
Patient reports she had a couple strokes in 2009.  She says she lost feeling on the left side of her body.  She says she has feeling now.  She denies any deficits.  Is not currently on any medications.  She was told that the reason she had the strokes was because of hypertension.  Her blood pressure is at goal right now 124/76

## 2022-05-14 ENCOUNTER — Telehealth: Payer: Self-pay

## 2022-05-14 LAB — CBC WITH DIFFERENTIAL/PLATELET
Absolute Monocytes: 713 cells/uL (ref 200–950)
Basophils Absolute: 44 cells/uL (ref 0–200)
Basophils Relative: 0.5 %
Eosinophils Absolute: 305 cells/uL (ref 15–500)
Eosinophils Relative: 3.5 %
HCT: 45.9 % — ABNORMAL HIGH (ref 35.0–45.0)
Hemoglobin: 15.4 g/dL (ref 11.7–15.5)
Lymphs Abs: 1583 cells/uL (ref 850–3900)
MCH: 30.7 pg (ref 27.0–33.0)
MCHC: 33.6 g/dL (ref 32.0–36.0)
MCV: 91.6 fL (ref 80.0–100.0)
MPV: 11.2 fL (ref 7.5–12.5)
Monocytes Relative: 8.2 %
Neutro Abs: 6055 cells/uL (ref 1500–7800)
Neutrophils Relative %: 69.6 %
Platelets: 290 10*3/uL (ref 140–400)
RBC: 5.01 10*6/uL (ref 3.80–5.10)
RDW: 12.5 % (ref 11.0–15.0)
Total Lymphocyte: 18.2 %
WBC: 8.7 10*3/uL (ref 3.8–10.8)

## 2022-05-14 LAB — LIPID PANEL
Cholesterol: 202 mg/dL — ABNORMAL HIGH (ref <200)
HDL: 54 mg/dL (ref >=50)
LDL Cholesterol (Calc): 107 mg/dL (calc) — ABNORMAL HIGH
Non-HDL Cholesterol (Calc): 148 mg/dL (calc) — ABNORMAL HIGH (ref <130)
Total CHOL/HDL Ratio: 3.7 (calc) (ref <5.0)
Triglycerides: 286 mg/dL — ABNORMAL HIGH (ref <150)

## 2022-05-14 LAB — HEMOGLOBIN A1C
Hgb A1c MFr Bld: 5.4 % of total Hgb (ref <5.7)
Mean Plasma Glucose: 108 mg/dL
eAG (mmol/L): 6 mmol/L

## 2022-05-14 LAB — COMPLETE METABOLIC PANEL WITH GFR
AG Ratio: 1.8 (calc) (ref 1.0–2.5)
ALT: 12 U/L (ref 6–29)
AST: 10 U/L (ref 10–35)
Albumin: 4.4 g/dL (ref 3.6–5.1)
Alkaline phosphatase (APISO): 39 U/L (ref 31–125)
BUN: 17 mg/dL (ref 7–25)
CO2: 22 mmol/L (ref 20–32)
Calcium: 9.8 mg/dL (ref 8.6–10.2)
Chloride: 107 mmol/L (ref 98–110)
Creat: 0.71 mg/dL (ref 0.50–0.99)
Globulin: 2.4 g/dL (calc) (ref 1.9–3.7)
Glucose, Bld: 95 mg/dL (ref 65–99)
Potassium: 4.1 mmol/L (ref 3.5–5.3)
Sodium: 139 mmol/L (ref 135–146)
Total Bilirubin: 0.3 mg/dL (ref 0.2–1.2)
Total Protein: 6.8 g/dL (ref 6.1–8.1)
eGFR: 106 mL/min/{1.73_m2} (ref >=60)

## 2022-05-14 LAB — HEPATITIS C ANTIBODY
Hepatitis C Ab: NONREACTIVE
SIGNAL TO CUT-OFF: 0.12 (ref <1.00)

## 2022-05-14 LAB — HIV ANTIBODY (ROUTINE TESTING W REFLEX): HIV 1&2 Ab, 4th Generation: NONREACTIVE

## 2022-05-14 NOTE — Telephone Encounter (Signed)
[facility] referring for History of endometriosis and fibroids, [place],Needs to establish care,. Sch with Dr.Evans Called and left voicemail for patient to call back to be scheduled.

## 2022-05-18 ENCOUNTER — Telehealth: Payer: Self-pay

## 2022-05-18 NOTE — Telephone Encounter (Signed)
CALLED PATIENT NO ANSWER LEFT VOICEMAIL FOR A CALL BACK LETTER SENT 

## 2022-05-24 NOTE — Telephone Encounter (Signed)
Called and left voicemail for patient to call back to be scheduled. 

## 2022-05-25 NOTE — Telephone Encounter (Signed)
Called and left voicemail for patient to call back to be scheduled. 

## 2022-05-27 NOTE — Telephone Encounter (Signed)
Called and left voicemail for patient to call back to be scheduled.  Multiple attempt to reach patient were unsuccessful. Contacting referring provider.

## 2022-10-06 IMAGING — US US PELVIS COMPLETE TRANSABD/TRANSVAG W DUPLEX AND/OR DOPPLER
1 series · 13 of 25 positions shown · non-contrast
Comparison: 04/05/2022

CLINICAL DATA: Right lower quadrant pain since this morning

EXAM:
TRANSABDOMINAL AND TRANSVAGINAL ULTRASOUND OF PELVIS
DOPPLER ULTRASOUND OF OVARIES
TECHNIQUE: Both transabdominal and transvaginal ultrasound examinations of the
pelvis were performed. Transabdominal technique was performed for
global imaging of the pelvis including uterus, ovaries, adnexal
regions, and pelvic cul-de-sac.
It was necessary to proceed with endovaginal exam following the
transabdominal exam to visualize the endometrium and adnexal
structures. Color and duplex Doppler ultrasound was utilized to
evaluate blood flow to the ovaries.

[Series 1: us pelvic complete w transvaginal and torsion righ · 13 of 96 slices shown]
[im 1/96]
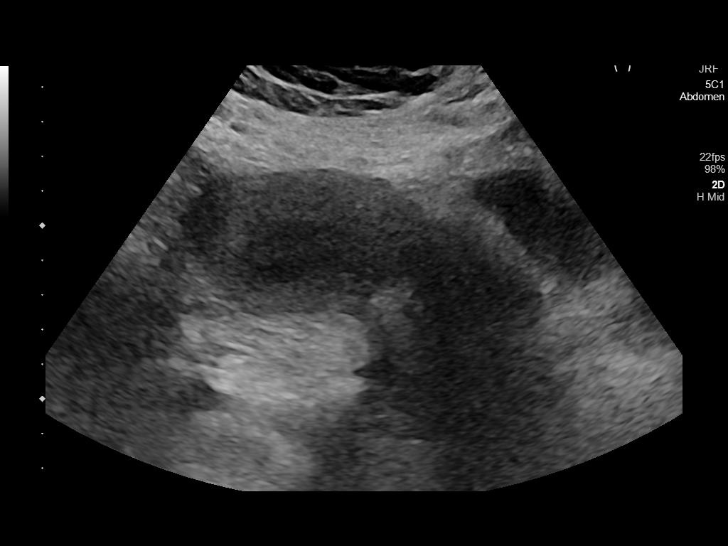
[im 8/96]
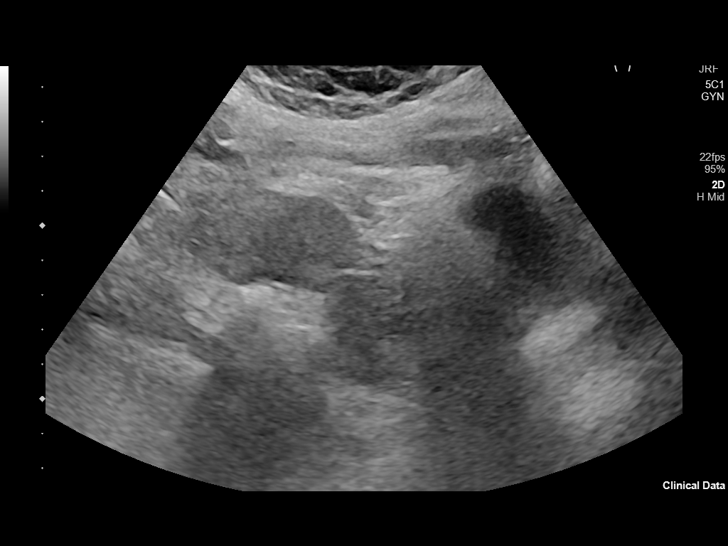
[im 16/96]
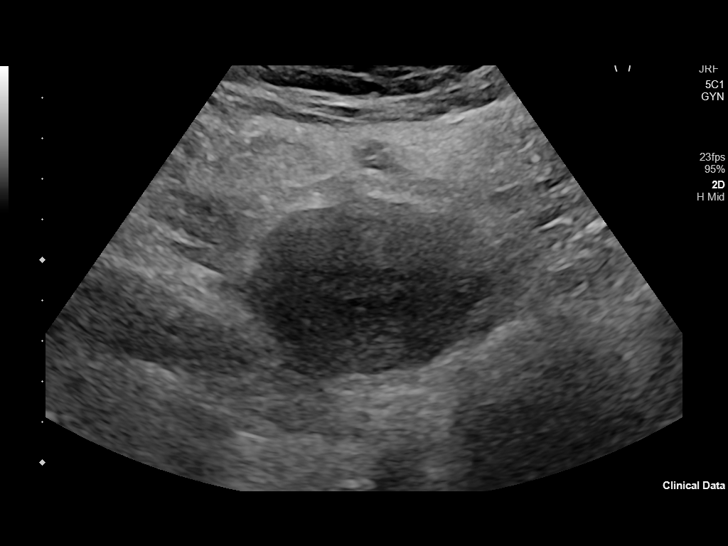
[im 24/96]
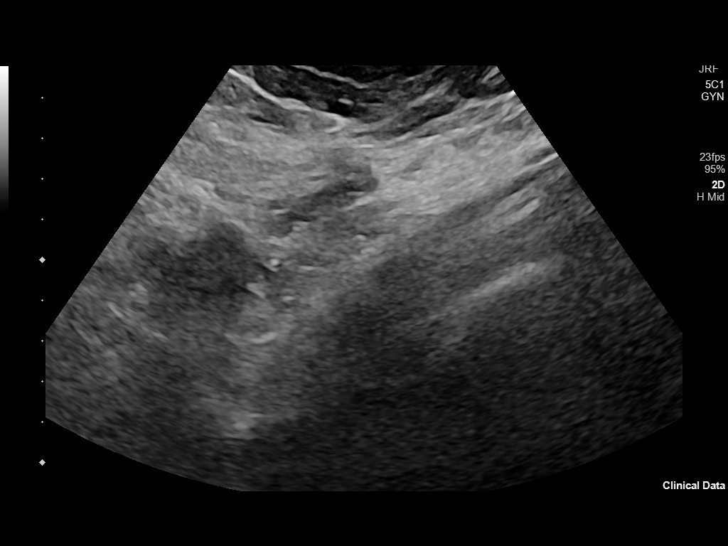
[im 32/96]
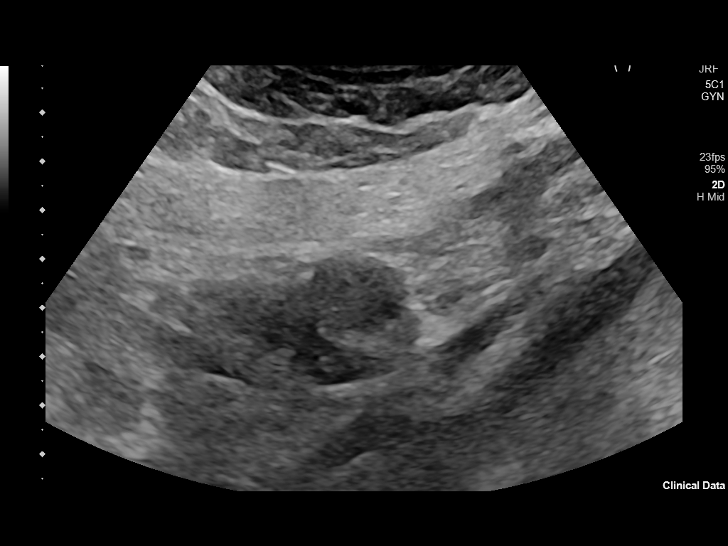
[im 40/96]
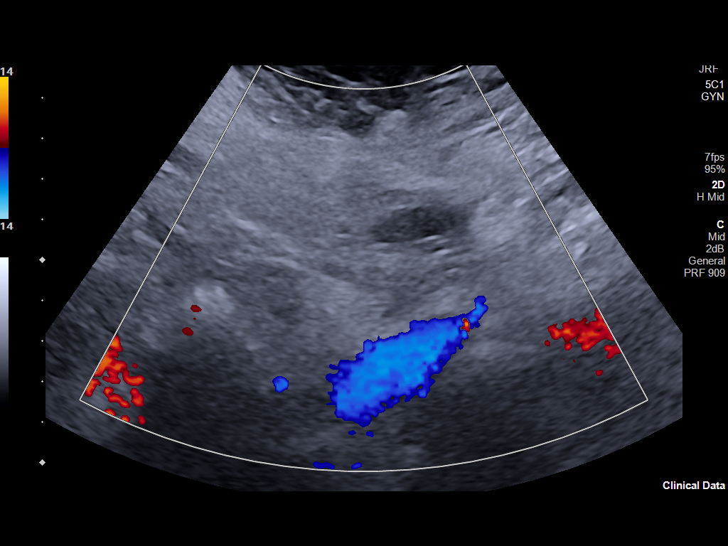
[im 48/96]
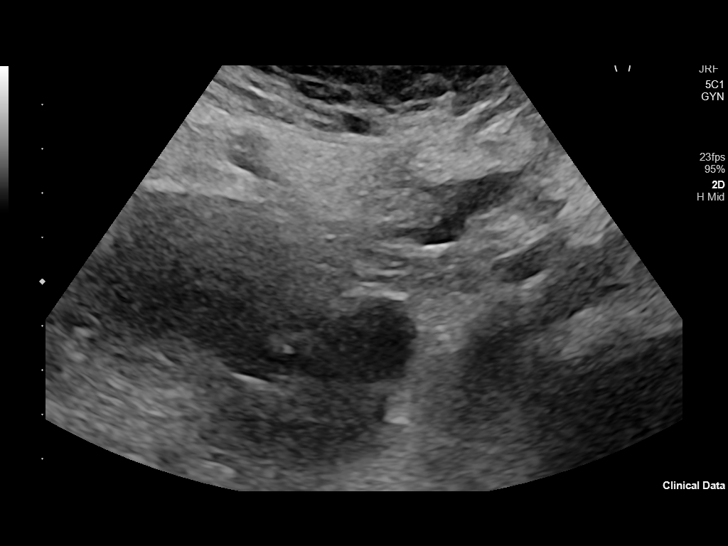
[im 56/96]
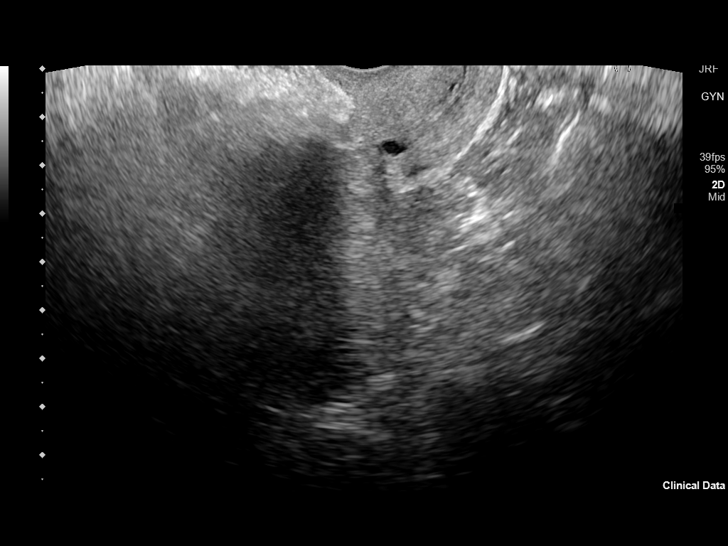
[im 64/96]
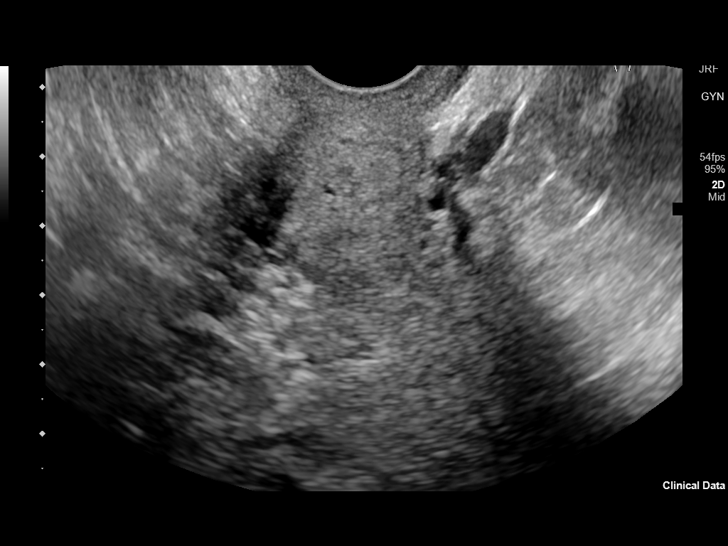
[im 72/96]
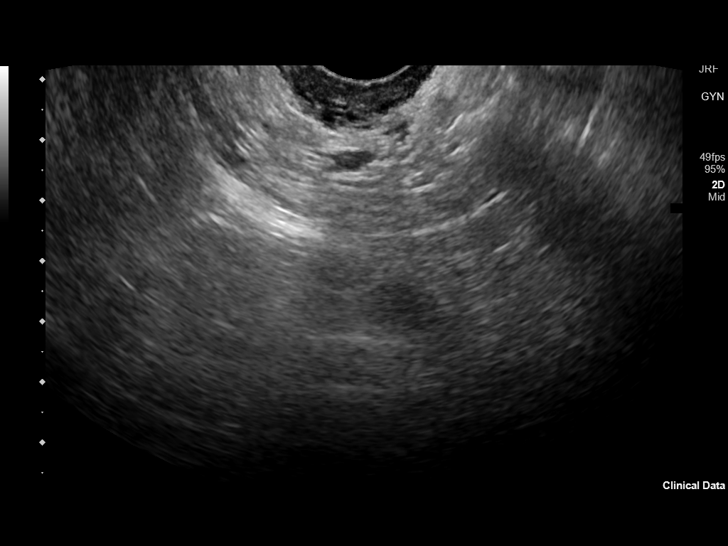
[im 80/96]
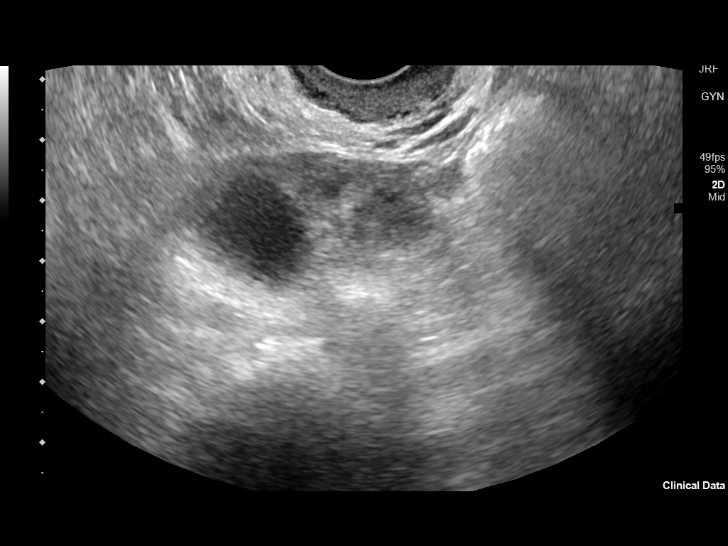
[im 88/96]
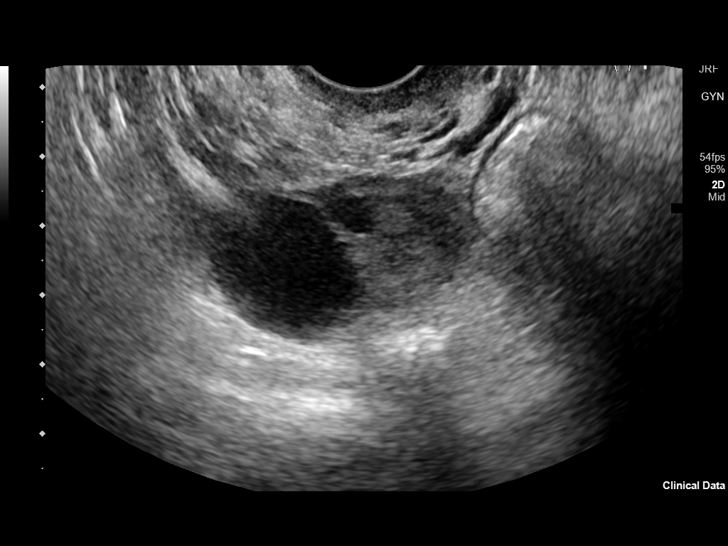
[im 96/96]
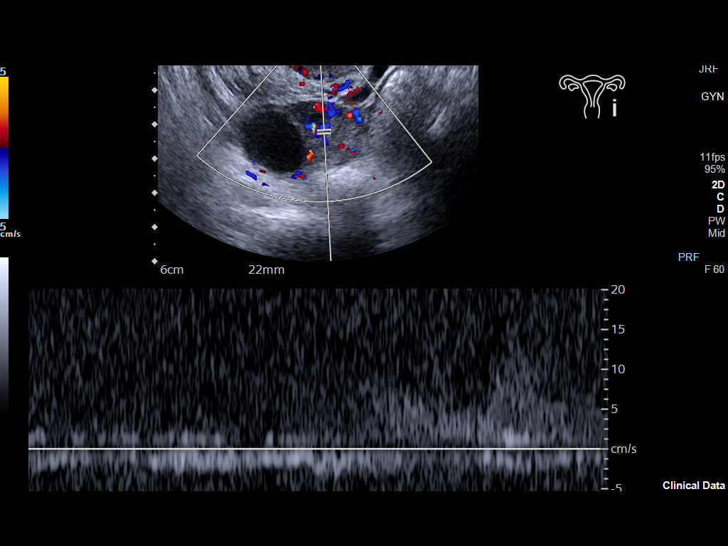

[13 of 25 positions shown; findings below may reference images not displayed]

FINDINGS: Uterus

Measurements: 9.2 x 4.9 x 4.9 cm = volume: 115.1 mL. No fibroids or
other mass visualized.

Endometrium

Thickness: 6 mm.  No focal abnormality visualized.

Right ovary

Measurements: 2.7 x 1.8 x 2.1 cm = volume: 5.5 mL. Normal
appearance/no adnexal mass.

Left ovary

Measurements: 3.9 x 2.1 x 2.4 cm = volume: 10.4 mL. Dominant
follicle within the left ovary unchanged since CT. Otherwise no
abnormalities.

Pulsed Doppler evaluation of both ovaries demonstrates normal
low-resistance arterial and venous waveforms.

Other findings

No abnormal free fluid.
IMPRESSION: 1. Age-appropriate pelvic ultrasound.  No acute findings.

## 2022-10-06 IMAGING — CT CT ABD-PELV W/ CM
2 of 5 series · 16 of 46 positions shown, 18 images · IV contrast (APPLIED)
Comparison: None.

CLINICAL DATA: Pain right lower quadrant

EXAM:
CT ABDOMEN AND PELVIS WITH CONTRAST
TECHNIQUE: Multidetector CT imaging of the abdomen and pelvis was performed
using the standard protocol following bolus administration of
intravenous contrast.

[Series 2: axial st · axial · 0.95mm/px · z∈[-952,-487]mm · 13 of 105 slices shown, 15 images]
[im 6/105  soft-tissue]
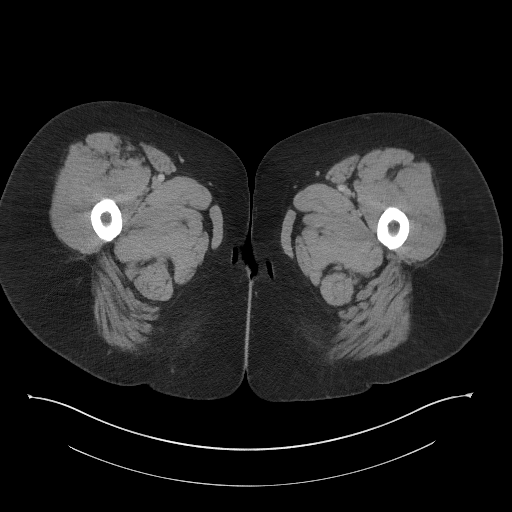
[im 6/105  bone]
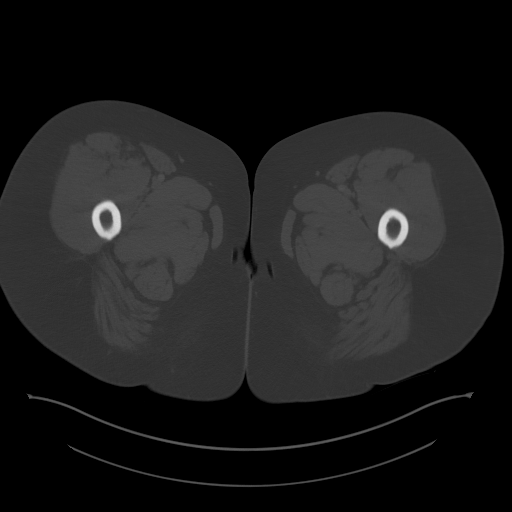
[im 16/105  soft-tissue]
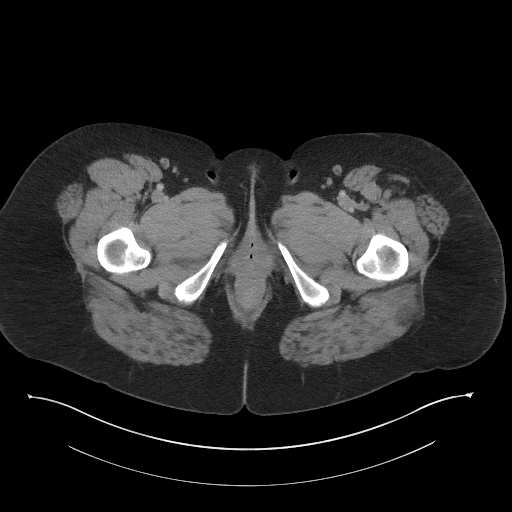
[im 21/105  soft-tissue]
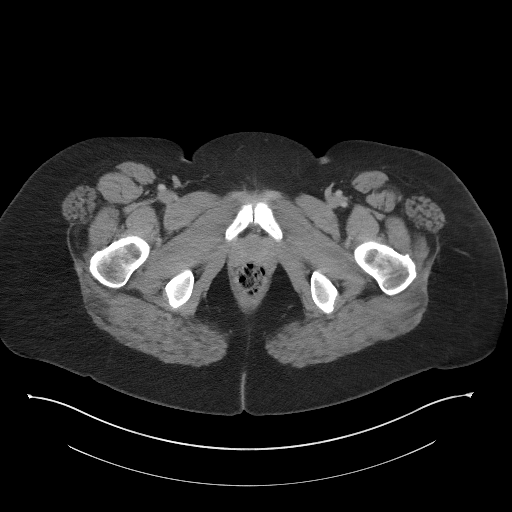
[im 32/105  soft-tissue]
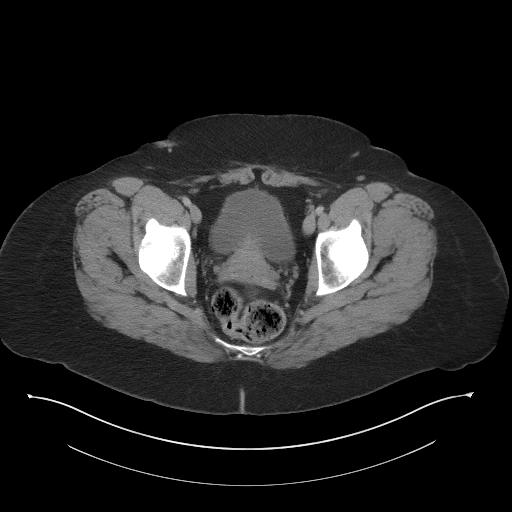
[im 37/105  soft-tissue]
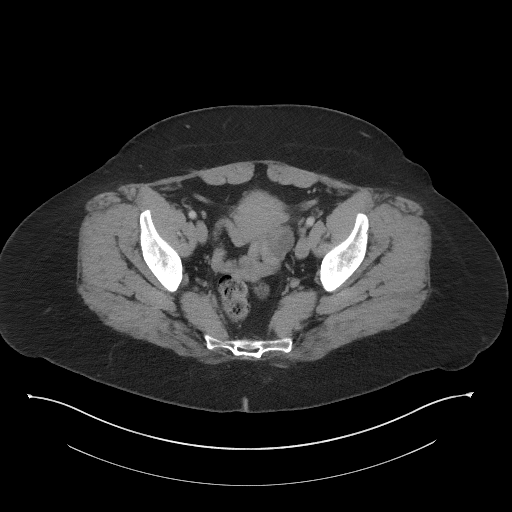
[im 47/105  soft-tissue]
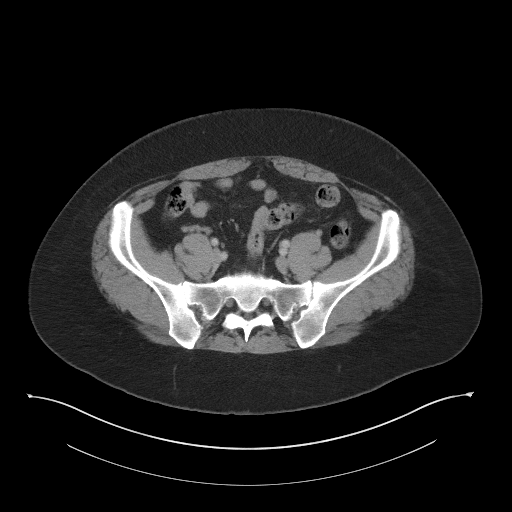
[im 53/105  soft-tissue]
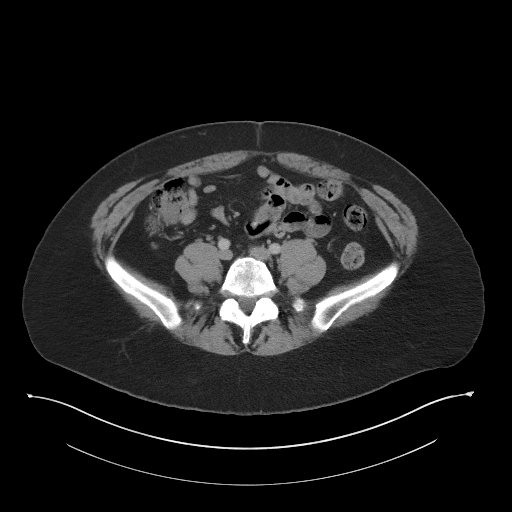
[im 58/105  soft-tissue]
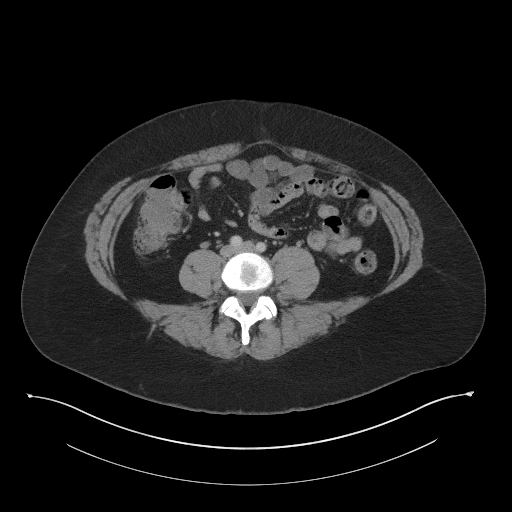
[im 68/105  soft-tissue]
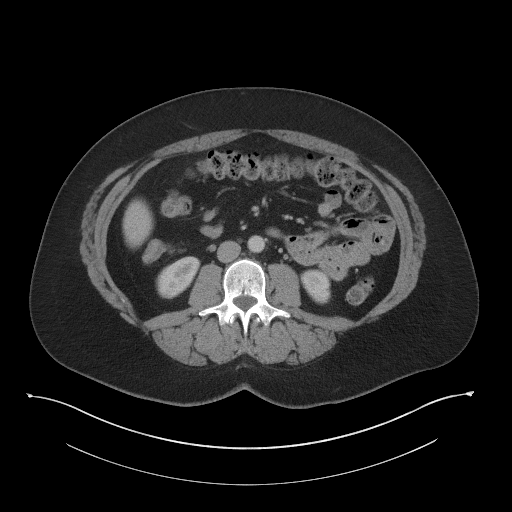
[im 68/105  bone]
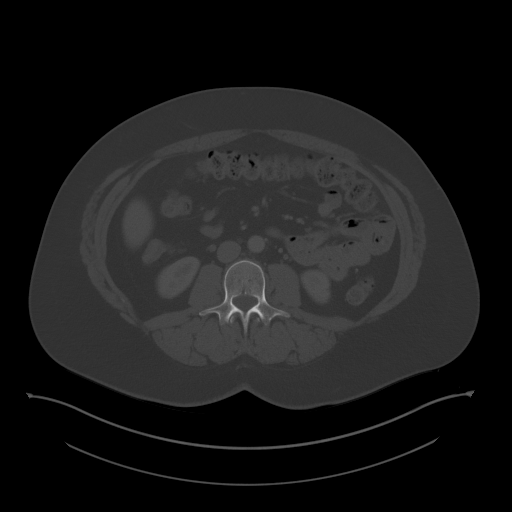
[im 73/105  soft-tissue]
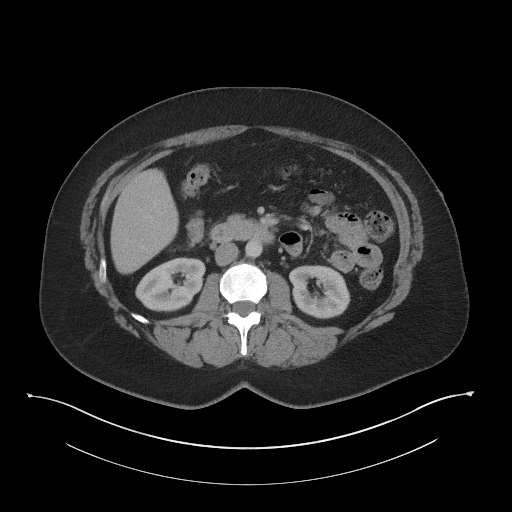
[im 84/105  soft-tissue]
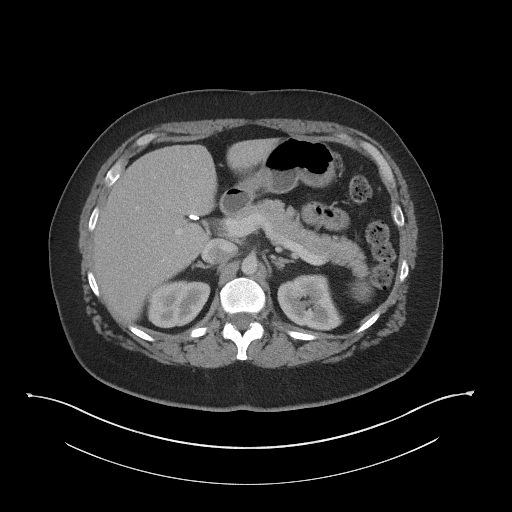
[im 89/105  soft-tissue]
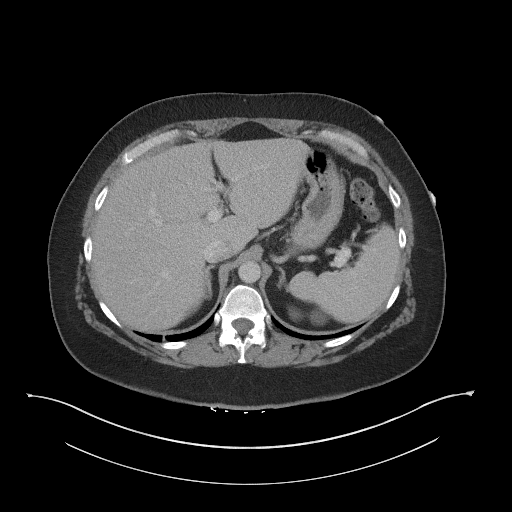
[im 99/105  soft-tissue]
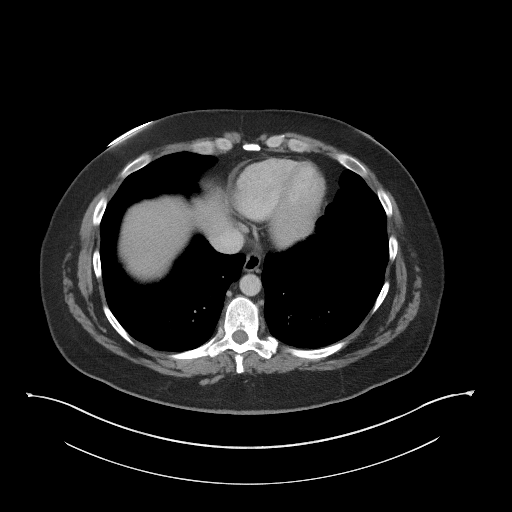

[Series 5: coronal st · coronal · 0.97mm/px · 3 of 108 slices shown]
[im 36/108  soft-tissue]
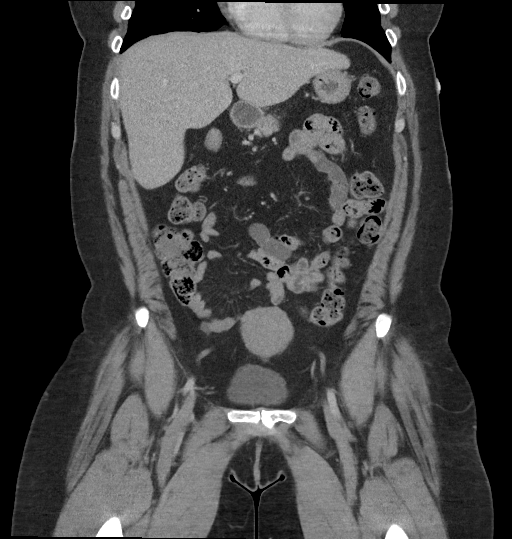
[im 48/108  soft-tissue]
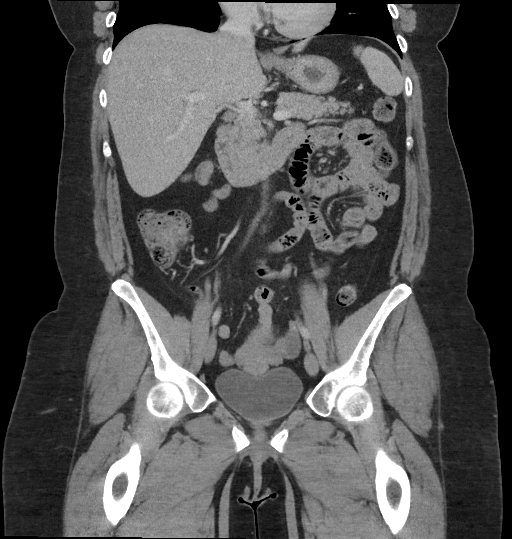
[im 60/108  soft-tissue]
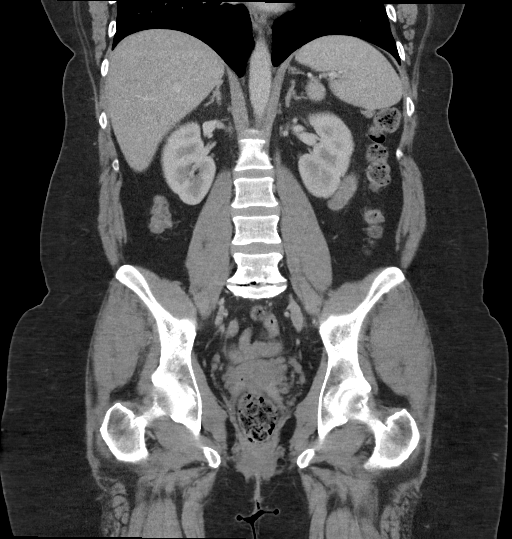

[16 of 46 positions shown; findings below may reference images not displayed]

RADIATION DOSE REDUCTION: This exam was performed according to the
departmental dose-optimization program which includes automated
exposure control, adjustment of the mA and/or kV according to
patient size and/or use of iterative reconstruction technique.

CONTRAST:  100mL OMNIPAQUE IOHEXOL 300 MG/ML  SOLN
FINDINGS: Lower chest: Small linear densities in the lower lung fields may
suggest minimal scarring or minimal subsegmental atelectasis.

Hepatobiliary: Liver measures 16.8 cm in length. In image 29 of
series 2, there is 6 mm low-density in the right lobe, possibly a
cyst or hemangioma. There is no dilation of bile ducts. Surgical
clips are seen in gallbladder fossa.

Pancreas: No focal abnormality is seen.

Spleen: Unremarkable.

Adrenals/Urinary Tract: Adrenals are not enlarged. There is no
hydronephrosis. There are no renal or ureteral stones. There is
cm cyst in the upper pole of left kidney. In the image 31 of series
2, there is 7 mm low-density structure which is too small to
optimally characterize. Density measurements appear higher than
usual for simple cyst. In image 37 there is 7 mm low-density lesion,
possibly a cyst.

Stomach/Bowel: Stomach is not distended. Small bowel loops are not
dilated. Appendix is not dilated. Scattered diverticula are seen in
the colon without signs of focal diverticulitis.

Vascular/Lymphatic: Unremarkable.

Reproductive: There is 2.1 cm low-density structure in the left
adnexa possibly a dominant follicle. There is mild lobulation in the
margin of the uterus, possibly small fibroids.

Other: There is no ascites or pneumoperitoneum. Small umbilical
hernia containing fat is seen.

Musculoskeletal: Degenerative changes are noted in the lumbar spine,
particularly severe at L5-S1 level with encroachment of neural
foramina.
IMPRESSION: There is no evidence of intestinal obstruction or pneumoperitoneum.
Appendix is not dilated. There is no hydronephrosis.

There is 6 mm low-density structure in the right lobe of liver,
possibly a cyst or hemangioma.

Bilateral renal cysts. There is 7 mm low-density structure in the
posterior midportion of right kidney with density measurements
higher than usual for simple cyst. Follow-up renal sonogram may be
considered.

Scattered diverticula are seen in colon without signs of focal
diverticulitis. 2.1 cm low-density structure in the left adnexa may
suggest ovarian follicle. Possible small uterine fibroids. Lumbar
spondylosis at L5-S1 level with encroachment of neural foramina.

Other findings as described in the body of the report.

## 2022-11-08 ENCOUNTER — Ambulatory Visit: Payer: PRIVATE HEALTH INSURANCE | Admitting: Nurse Practitioner

## 2024-05-21 ENCOUNTER — Ambulatory Visit: Payer: PRIVATE HEALTH INSURANCE | Admitting: Nurse Practitioner

## 2024-05-21 ENCOUNTER — Encounter: Payer: Self-pay | Admitting: Nurse Practitioner

## 2024-05-21 ENCOUNTER — Other Ambulatory Visit (HOSPITAL_COMMUNITY)
Admission: RE | Admit: 2024-05-21 | Discharge: 2024-05-21 | Disposition: A | Discharge status: Home / Self Care | Payer: PRIVATE HEALTH INSURANCE | Source: Ambulatory Visit | Attending: Nurse Practitioner | Admitting: Nurse Practitioner

## 2024-05-21 ENCOUNTER — Ambulatory Visit: Payer: Self-pay

## 2024-05-21 VITALS — BP 122/74 | HR 85 | Temp 98.1°F | Ht 73.0 in | Wt 267.9 lb

## 2024-05-21 DIAGNOSIS — N939 Abnormal uterine and vaginal bleeding, unspecified: Secondary | ICD-10-CM | POA: Diagnosis not present

## 2024-05-21 LAB — POCT URINALYSIS DIPSTICK
Bilirubin, UA: NEGATIVE
Blood, UA: POSITIVE
Glucose, UA: NEGATIVE
Ketones, UA: NEGATIVE
Leukocytes, UA: NEGATIVE
Nitrite, UA: NEGATIVE
Odor: NORMAL
Protein, UA: NEGATIVE
Spec Grav, UA: <=1.005 — AB (ref 1.010–1.025)
Urobilinogen, UA: 0.2 U/dL
pH, UA: 6.5 (ref 5.0–8.0)

## 2024-05-21 LAB — CBC WITH DIFFERENTIAL/PLATELET
Absolute Lymphocytes: 2340 {cells}/uL (ref 850–3900)
Absolute Monocytes: 880 {cells}/uL (ref 200–950)
Basophils Absolute: 60 {cells}/uL (ref 0–200)
Basophils Relative: 0.6 %
Eosinophils Absolute: 390 {cells}/uL (ref 15–500)
Eosinophils Relative: 3.9 %
HCT: 45.4 % — ABNORMAL HIGH (ref 35.0–45.0)
Hemoglobin: 15 g/dL (ref 11.7–15.5)
MCH: 30.5 pg (ref 27.0–33.0)
MCHC: 33 g/dL (ref 32.0–36.0)
MCV: 92.5 fL (ref 80.0–100.0)
MPV: 11.5 fL (ref 7.5–12.5)
Monocytes Relative: 8.8 %
Neutro Abs: 6330 {cells}/uL (ref 1500–7800)
Neutrophils Relative %: 63.3 %
Platelets: 317 10*3/uL (ref 140–400)
RBC: 4.91 10*6/uL (ref 3.80–5.10)
RDW: 12.8 % (ref 11.0–15.0)
Total Lymphocyte: 23.4 %
WBC: 10 10*3/uL (ref 3.8–10.8)

## 2024-05-21 LAB — POCT URINE PREGNANCY: Preg Test, Ur: NEGATIVE

## 2024-05-21 LAB — PROLACTIN: Prolactin: 9.2 ng/mL

## 2024-05-21 LAB — FSH/LH
FSH: 9 m[IU]/mL
LH: 4.5 m[IU]/mL

## 2024-05-21 LAB — ESTRADIOL: Estradiol: 87 pg/mL

## 2024-05-21 LAB — TSH: TSH: 0.99 m[IU]/L

## 2024-05-21 NOTE — Progress Notes (Signed)
 BP 122/74 (BP Location: Right Arm, Patient Position: Sitting, Cuff Size: Normal)   Pulse 85   Temp 98.1 F (36.7 C) (Oral)   Ht 6\' 1"  (1.854 m)   Wt 267 lb 14.4 oz (121.5 kg)   LMP 05/07/2024 (Exact Date)   SpO2 98%   BMI 35.35 kg/m    Subjective:    Patient ID: Brandi Ford, female    DOB: Jul 18, 1976, 48 y.o.   MRN: [id number]  HPI: Brandi Ford is a 48 y.o. female  Chief Complaint  Patient presents with   Menstrual Problem    Onset May 19, has been bleeding ever since. Patient says she had a period in march and skipped April. Noticed small clots yesterday and today    Discussed the use of AI scribe software for clinical note transcription with the patient, who gave verbal consent to proceed.  History of Present Illness Brandi Ford is a 48 year old female with endometriosis and fibroids who presents with abnormal menstrual bleeding.  She has been experiencing continuous abnormal menstrual bleeding since May 19th, following a skipped period in April. The bleeding is accompanied by small clots. There have been no previous episodes of similar prolonged bleeding.  Her history of endometriosis and fibroids was identified during a previous emergency room visit. She has not yet attended a gynecologist appointment.  Her last Pap smear in 2022 was normal. She experiences some cramping, which she manages with Tylenol  or ibuprofen, finding relief with these medications. No severe cramping akin to her past experiences with endometriosis.  Due to the bleeding, she has been wearing pads constantly, leading to some itching. No symptoms of a urinary tract infection.         05/21/2024    1:13 PM 05/13/2022   10:43 AM  Depression screen PHQ 2/9  Decreased Interest 0 0  Down, Depressed, Hopeless 0 0  PHQ - 2 Score 0 0  Altered sleeping 0   Tired, decreased energy 0   Change in appetite 0   Feeling bad or failure about yourself  0   Trouble concentrating 0   Moving slowly or  fidgety/restless 0   Suicidal thoughts 0   PHQ-9 Score 0   Difficult doing work/chores Not difficult at all     Relevant past medical, surgical, family and social history reviewed and updated as indicated. Interim medical history since our last visit reviewed. Allergies and medications reviewed and updated.  Review of Systems  Ten systems reviewed and is negative except as mentioned in HPI      Objective:      BP 122/74 (BP Location: Right Arm, Patient Position: Sitting, Cuff Size: Normal)   Pulse 85   Temp 98.1 F (36.7 C) (Oral)   Ht 6\' 1"  (1.854 m)   Wt 267 lb 14.4 oz (121.5 kg)   LMP 05/07/2024 (Exact Date)   SpO2 98%   BMI 35.35 kg/m    Wt Readings from Last 3 Encounters:  05/21/24 267 lb 14.4 oz (121.5 kg)  05/13/22 248 lb 8 oz (112.7 kg)  04/05/22 240 lb (108.9 kg)    Physical Exam Vitals reviewed. Exam conducted with a chaperone present.  Constitutional:      Appearance: Normal appearance.  HENT:     Head: Normocephalic.  Cardiovascular:     Rate and Rhythm: Normal rate and regular rhythm.  Pulmonary:     Effort: Pulmonary effort is normal.     Breath sounds: Normal breath sounds.  Genitourinary:  Vagina: Bleeding present.     Cervix: Cervical bleeding present.     Uterus: Normal.      Adnexa: Left adnexa normal.       Right: Tenderness present.   Musculoskeletal:        General: Normal range of motion.  Skin:    General: Skin is warm and dry.  Neurological:     General: No focal deficit present.     Mental Status: She is alert and oriented to person, place, and time. Mental status is at baseline.  Psychiatric:        Mood and Affect: Mood normal.        Behavior: Behavior normal.        Thought Content: Thought content normal.        Judgment: Judgment normal.      Results for orders placed or performed in visit on 05/21/24  POCT urine pregnancy   Collection Time: 05/21/24  1:42 PM  Result Value Ref Range   Preg Test, Ur Negative  Negative  POCT urinalysis dipstick   Collection Time: 05/21/24  1:42 PM  Result Value Ref Range   Color, UA yellow    Clarity, UA clear    Glucose, UA Negative Negative   Bilirubin, UA neg    Ketones, UA neg    Spec Grav, UA <=1.005 (A) 1.010 - 1.025   Blood, UA pos    pH, UA 6.5 5.0 - 8.0   Protein, UA Negative Negative   Urobilinogen, UA 0.2 0.2 or 1.0 E.U./dL   Nitrite, UA neg    Leukocytes, UA Negative Negative   Appearance clear    Odor normal           Assessment & Plan:   Problem List Items Addressed This Visit   None Visit Diagnoses       Abnormal vaginal bleeding    -  Primary   Relevant Orders   CBC with Differential/Platelet   TSH   FSH/LH   US  PELVIC COMPLETE WITH TRANSVAGINAL   Ambulatory referral to Gynecology   POCT urine pregnancy (Completed)   POCT urinalysis dipstick (Completed)   Cervicovaginal ancillary only   Estradiol   Prolactin   Pelvic cart   Care order/instruction: Notify provider when chaperone is available and pt is ready for exam.        Assessment and Plan Assessment & Plan Abnormal uterine bleeding Abnormal uterine bleeding since May 19th, following a skipped period in April. Continuous bleeding with small clots, described as 'chunks'. No history of similar episodes. Differential diagnosis includes fibroids, endometriosis, and potential onset of menopause. Pelvic exam revealed no cervical tenderness, blood in the vault.  Negative pregnancy test.  - Order blood work - Order pelvic ultrasound - Perform urine dip -urine hcg-negative - Perform vaginal swab - Refer to GYN   Uterine fibroids Previously identified via ultrasound. Potential contributor to current abnormal uterine bleeding.  Endometriosis Previously causing mild cramping. Current cramping is mild and managed with Tylenol  or ibuprofen.        Follow up plan: Return if symptoms worsen or fail to improve.

## 2024-05-21 NOTE — Progress Notes (Signed)
 Brandi Buckler, FNP   Chief Complaint  Patient presents with   Vaginal Bleeding    20+ years monthly cycles until March 2025, No cycle in March and April. Started cycle May 19 and hasn't stopped since then. Light to heavy flow, mild cramping.    HPI:      [name] is a 48 y.o. [id number] whose LMP was Patient's last menstrual period was 05/07/2024 (exact date)., presents today for NP eval of AUB, referred by PCP. Menses are usually monthly, lasting 3-5 days, mod flow, no BTB, mild dysmen now. Hx of endometriosis and had to use Rx ibup 800 in past; s/p 4 dx lap in past for endometriosis/adhesions. MD had discussed hyst in past.  Pt had normal menses 3/25; skipped 4/25 menses. May menses started on time but hasn't stopped and is heavier and with small clots, mild dysmen. No recent stress/wt changes. Had normal thyroid/prolactin/CBC labs with PCP yesterday; GYN u/s ordered. Hx of possible leio on CT pelvic 4/23 but Gyn u/s confirmed no leio (even though mentioned in PMH).  Hx of CVA/HTN Neg pap 2022 She is sexually active, no pain/bleeding/dryness. No VS sx. S/p TL.   Patient Active Problem List   Diagnosis Date Noted   Essential hypertension 05/13/2022   History of CVA (cerebrovascular accident) without residual deficits 05/13/2022   Mild intermittent asthma 05/13/2022   Chronic obstructive pulmonary disease (HCC) 05/13/2022   Cigarette nicotine dependence with nicotine-induced disorder 05/13/2022   H/O multiple allergies 05/13/2022   Endometriosis 05/13/2022    Past Surgical History:  Procedure Laterality Date   CESAREAN SECTION     2002 and 2006   CHOLECYSTECTOMY     TONSILLECTOMY     TUBAL LIGATION  2010    Family History  Problem Relation Age of Onset   Breast cancer Mother 68   Uterine cancer Maternal Grandmother        unknown age    Social History   Socioeconomic History   Marital status: Married    Spouse name: Not on file   Number of children: Not  on file   Years of education: Not on file   Highest education level: Not on file  Occupational History   Not on file  Tobacco Use   Smoking status: Every Day    Types: Cigarettes   Smokeless tobacco: Never  Vaping Use   Vaping status: Never Used  Substance and Sexual Activity   Alcohol use: Yes    Comment: very rare   Drug use: Never   Sexual activity: Yes    Partners: Female    Birth control/protection: Surgical    Comment: Tubal Ligation  Other Topics Concern   Not on file  Social History Narrative   Moved from [place]    Social Drivers of Health   Financial Resource Strain: Not on file  Food Insecurity: Not on file  Transportation Needs: Not on file  Physical Activity: Not on file  Stress: Not on file  Social Connections: Not on file  Intimate Partner Violence: Not on file    Outpatient Medications Prior to Visit  Medication Sig Dispense Refill   ALBUTEROL IN Inhale into the lungs as needed.     Multiple Vitamin (MULTIVITAMIN) tablet Take 1 tablet by mouth daily.     No facility-administered medications prior to visit.      ROS:  Review of Systems  Constitutional:  Negative for fever.  Gastrointestinal:  Negative for blood in stool, constipation, diarrhea,  nausea and vomiting.  Genitourinary:  Positive for menstrual problem. Negative for dyspareunia, dysuria, flank pain, frequency, hematuria, urgency, vaginal bleeding, vaginal discharge and vaginal pain.  Musculoskeletal:  Negative for back pain.  Skin:  Negative for rash.   BREAST: No symptoms   OBJECTIVE:   Vitals:  BP 129/81   Pulse 80   Ht 5\' 9"  (1.753 m)   Wt 270 lb (122.5 kg)   LMP 05/07/2024 (Exact Date)   BMI 39.87 kg/m   Physical Exam Vitals reviewed.  Constitutional:      Appearance: She is well-developed.  Pulmonary:     Effort: Pulmonary effort is normal.  Abdominal:     Palpations: Abdomen is soft.     Tenderness: There is abdominal tenderness in the suprapubic area. There is  no guarding or rebound.  Genitourinary:    General: Normal vulva.     Pubic Area: No rash.      Labia:        Right: No rash, tenderness or lesion.        Left: No rash, tenderness or lesion.      Vagina: Bleeding present. No vaginal discharge, erythema or tenderness.     Uterus: Normal. Tender. Not enlarged.      Adnexa: Right adnexa normal and left adnexa normal.       Right: No mass or tenderness.         Left: No mass or tenderness.    Musculoskeletal:        General: Normal range of motion.     Cervical back: Normal range of motion.  Skin:    General: Skin is warm and dry.  Neurological:     General: No focal deficit present.     Mental Status: She is alert and oriented to person, place, and time.  Psychiatric:        Mood and Affect: Mood normal.        Behavior: Behavior normal.        Thought Content: Thought content normal.        Judgment: Judgment normal.    Assessment/Plan: Abnormal uterine bleeding (AUB) - Plan: US  PELVIS TRANSVAGINAL NON-OB (TV ONLY), norethindrone (AYGESTIN) 5 MG tablet; for several wks, normal labs. Check Gyn u/s and pap, Rx aygestin to stop bleeding. Will f/u with results and sx. Pt with hx of CVA/HTN.   Cervical cancer screening - Plan: Cytology - PAP  Screening for HPV (human papillomavirus) - Plan: Cytology - PAP   Meds ordered this encounter  Medications   norethindrone (AYGESTIN) 5 MG tablet    Sig: Take 1 tablet (5 mg total) by mouth daily for 7 days.    Dispense:  7 tablet    Refill:  0    Supervising Provider:   ROBY, MICIA [[id number]]      Return in about 2 days (around 05/24/2024) for GYN u/s for AUB--[facility] to call pt.  Brandi Ford B. Amoree Newlon, PA-C 05/22/2024 11:45 AM

## 2024-05-21 NOTE — Telephone Encounter (Signed)
 Copied From CRM [id number]. Reason for Triage: Patient states that she has been on her menstrual cycle since 05/07/24 and her flow is not slowing down. Her last normal period was in March and didn't have a period in April. Cycle has been normal for her entire life up until now.   Patient can be reached at [phone]   Chief Complaint: Abnormal menstrual bleeding Symptoms: Missed period and period lasting about 2 weeks Frequency: 2 weeks  Pertinent Negatives: Patient denies abdominal pain Disposition: [] ED /[] Urgent Care (no appt availability in office) / [x] Appointment(In office/virtual)/ []  [facility]/ [] Home Care/ [] Refused Recommended Disposition /[] [facility]/ []  Follow-up with PCP Additional Notes: Patient called in to report abnormal vaginal bleeding. Patient stated she has had normal menstrual cycles her entire life. Patient stated she skipped her period in April and started her period on 05/07/24 and is still experiencing bleeding. Patient denied abdominal pain and fever. Patient stated she soaks 4-5 pads a day. Patient stated chances of pregnancy would be very minimal. Advised patient to follow-up with provider in office. Scheduled patient for this afternoon with PCP. Provided care advice and instructed patient to call back if symptoms worsen. Patient complied.   Reason for Disposition  Periods last > 7 days  Answer Assessment - Initial Assessment Questions 1. AMOUNT: "Describe the bleeding that you are having."    - SPOTTING: spotting, or pinkish / brownish mucous discharge; does not fill panty liner or pad    - MILD:  less than 1 pad / hour; less than patient's usual menstrual bleeding   - MODERATE: 1-2 pads / hour; 1 menstrual cup every 6 hours; small-medium blood clots (e.g., pea, grape, small coin)   - SEVERE: soaking 2 or more pads/hour for 2 or more hours; 1 menstrual cup every 2 hours; bleeding not contained by pads or continuous red blood from vagina;  large blood clots (e.g., golf ball, large coin)      States bleeding has not tapered off, 4-5 pads a day 2. ONSET: "When did the bleeding begin?" "Is it continuing now?"     Started 05/07/24 and still ongoing 3. MENSTRUAL PERIOD: "When was the last normal menstrual period?" "How is this different than your period?"     Last normal cycle was in March 4. REGULARITY: "How regular are your periods?"     States she skipped her period in April for the first time, states cycles have been normal her entire life 5. ABDOMEN PAIN: "Do you have any pain?" "How bad is the pain?"  (e.g., Scale 1-10; mild, moderate, or severe)   - MILD (1-3): doesn't interfere with normal activities, abdomen soft and not tender to touch    - MODERATE (4-7): interferes with normal activities or awakens from sleep, abdomen tender to touch    - SEVERE (8-10): excruciating pain, doubled over, unable to do any normal activities      Mild period cramps 6. PREGNANCY: "Is there any chance you are pregnant?" "When was your last menstrual period?"     Denies, states she had a tubal ligation done in 2010 9. BLOOD THINNER MEDICINES: "Do you take any blood thinners?" (e.g., Coumadin / warfarin, Pradaxa / dabigatran, aspirin)     Denies 10. CAUSE: "What do you think is causing the bleeding?" (e.g., recent gyn surgery, recent gyn procedure; known bleeding disorder, cervical cancer, polycystic ovarian disease, fibroids)         Unknown  11. HEMODYNAMIC STATUS: "Are you weak or  feeling lightheaded?" If Yes, ask: "Can you stand and walk normally?"        States she feels tired, but recently had a "bug" 12. OTHER SYMPTOMS: "What other symptoms are you having with the bleeding?" (e.g., passed tissue, vaginal discharge, fever, menstrual-type cramps)       Recent "bug"- fever and nasal congestion  Protocols used: Vaginal Bleeding - Abnormal-A-AH

## 2024-05-22 ENCOUNTER — Ambulatory Visit (INDEPENDENT_AMBULATORY_CARE_PROVIDER_SITE_OTHER): Payer: PRIVATE HEALTH INSURANCE | Admitting: Obstetrics and Gynecology

## 2024-05-22 ENCOUNTER — Ambulatory Visit: Payer: Self-pay | Admitting: Nurse Practitioner

## 2024-05-22 ENCOUNTER — Other Ambulatory Visit (HOSPITAL_COMMUNITY)
Admission: RE | Admit: 2024-05-22 | Discharge: 2024-05-22 | Disposition: A | Discharge status: Home / Self Care | Payer: PRIVATE HEALTH INSURANCE | Source: Ambulatory Visit | Attending: Obstetrics and Gynecology | Admitting: Obstetrics and Gynecology

## 2024-05-22 ENCOUNTER — Encounter: Payer: Self-pay | Admitting: Obstetrics and Gynecology

## 2024-05-22 VITALS — BP 129/81 | HR 80 | Ht 69.0 in | Wt 270.0 lb

## 2024-05-22 DIAGNOSIS — Z124 Encounter for screening for malignant neoplasm of cervix: Secondary | ICD-10-CM

## 2024-05-22 DIAGNOSIS — Z1151 Encounter for screening for human papillomavirus (HPV): Secondary | ICD-10-CM | POA: Diagnosis present

## 2024-05-22 DIAGNOSIS — B9689 Other specified bacterial agents as the cause of diseases classified elsewhere: Secondary | ICD-10-CM

## 2024-05-22 DIAGNOSIS — N939 Abnormal uterine and vaginal bleeding, unspecified: Secondary | ICD-10-CM

## 2024-05-22 MED ORDER — NORETHINDRONE ACETATE 5 MG PO TABS: 5.0000 mg | ORAL_TABLET | Freq: Every day | ORAL | 0 refills | Status: DC

## 2024-05-22 NOTE — Patient Instructions (Signed)
 I value your feedback and you entrusting [place] with your care. If you get a [place] patient survey, I would appreciate you taking the time to let us know about your experience today. Thank you! ? ? ?

## 2024-05-23 LAB — CERVICOVAGINAL ANCILLARY ONLY
Bacterial Vaginitis (gardnerella): POSITIVE — AB
Candida Glabrata: NEGATIVE
Candida Vaginitis: NEGATIVE
Chlamydia: NEGATIVE
Comment: NEGATIVE
Comment: NEGATIVE
Comment: NEGATIVE
Comment: NEGATIVE
Comment: NEGATIVE
Comment: NORMAL
Neisseria Gonorrhea: NEGATIVE
Trichomonas: NEGATIVE

## 2024-05-24 LAB — CYTOLOGY - PAP
Adequacy: ABSENT
Comment: NEGATIVE
Diagnosis: NEGATIVE
High risk HPV: NEGATIVE

## 2024-05-24 MED ORDER — METRONIDAZOLE 500 MG PO TABS: 500.0000 mg | ORAL_TABLET | Freq: Two times a day (BID) | ORAL | 0 refills | Status: AC

## 2024-05-27 ENCOUNTER — Ambulatory Visit: Payer: Self-pay | Admitting: Obstetrics and Gynecology

## 2024-05-31 ENCOUNTER — Other Ambulatory Visit: Payer: Self-pay | Admitting: Obstetrics and Gynecology

## 2024-05-31 DIAGNOSIS — N939 Abnormal uterine and vaginal bleeding, unspecified: Secondary | ICD-10-CM

## 2024-05-31 MED ORDER — NORETHINDRONE ACETATE 5 MG PO TABS
5.0000 mg | ORAL_TABLET | Freq: Every day | ORAL | 0 refills | Status: DC
Start: 2024-05-31 — End: 2024-06-17

## 2024-05-31 NOTE — Progress Notes (Signed)
 Rx RF aygestin  for AUB till get GYN u/s. Bleeding restarted after finishing aygesting

## 2024-06-05 ENCOUNTER — Ambulatory Visit: Payer: PRIVATE HEALTH INSURANCE

## 2024-06-05 DIAGNOSIS — N939 Abnormal uterine and vaginal bleeding, unspecified: Secondary | ICD-10-CM

## 2024-06-07 ENCOUNTER — Encounter: Payer: Self-pay | Admitting: Obstetrics and Gynecology

## 2024-06-08 NOTE — Telephone Encounter (Signed)
 Pt called triage to check on this message today I let her know I would let provider know.

## 2024-06-17 ENCOUNTER — Emergency Department
Admission: EM | Admit: 2024-06-17 | Discharge: 2024-06-17 | Disposition: A | Discharge status: Home / Self Care | Payer: PRIVATE HEALTH INSURANCE | Attending: Emergency Medicine | Admitting: Emergency Medicine

## 2024-06-17 ENCOUNTER — Other Ambulatory Visit: Payer: Self-pay

## 2024-06-17 DIAGNOSIS — N939 Abnormal uterine and vaginal bleeding, unspecified: Secondary | ICD-10-CM | POA: Diagnosis present

## 2024-06-17 DIAGNOSIS — J449 Chronic obstructive pulmonary disease, unspecified: Secondary | ICD-10-CM | POA: Insufficient documentation

## 2024-06-17 DIAGNOSIS — I10 Essential (primary) hypertension: Secondary | ICD-10-CM | POA: Diagnosis not present

## 2024-06-17 HISTORY — DX: Unspecified ovarian cyst, unspecified side: N83.209

## 2024-06-17 LAB — CBC
HCT: 45.1 % (ref 36.0–46.0)
Hemoglobin: 15.3 g/dL — ABNORMAL HIGH (ref 12.0–15.0)
MCH: 30.8 pg (ref 26.0–34.0)
MCHC: 33.9 g/dL (ref 30.0–36.0)
MCV: 90.7 fL (ref 80.0–100.0)
Platelets: 293 10*3/uL (ref 150–400)
RBC: 4.97 MIL/uL (ref 3.87–5.11)
RDW: 12.8 % (ref 11.5–15.5)
WBC: 9.4 10*3/uL (ref 4.0–10.5)
nRBC: 0 % (ref 0.0–0.2)

## 2024-06-17 LAB — COMPREHENSIVE METABOLIC PANEL WITH GFR
ALT: 19 U/L (ref 0–44)
AST: 16 U/L (ref 15–41)
Albumin: 4.2 g/dL (ref 3.5–5.0)
Alkaline Phosphatase: 36 U/L — ABNORMAL LOW (ref 38–126)
Anion gap: 9 (ref 5–15)
BUN: 12 mg/dL (ref 6–20)
CO2: 22 mmol/L (ref 22–32)
Calcium: 9.5 mg/dL (ref 8.9–10.3)
Chloride: 109 mmol/L (ref 98–111)
Creatinine, Ser: 0.69 mg/dL (ref 0.44–1.00)
GFR, Estimated: >60 mL/min (ref >60)
Glucose, Bld: 95 mg/dL (ref 70–99)
Potassium: 3.5 mmol/L (ref 3.5–5.1)
Sodium: 140 mmol/L (ref 135–145)
Total Bilirubin: 0.5 mg/dL (ref 0.0–1.2)
Total Protein: 7.3 g/dL (ref 6.5–8.1)

## 2024-06-17 LAB — ABO/RH: ABO/RH(D): A POS

## 2024-06-17 LAB — LIPASE, BLOOD: Lipase: 32 U/L (ref 11–51)

## 2024-06-17 MED ORDER — MEDROXYPROGESTERONE ACETATE 10 MG PO TABS: 10.0000 mg | ORAL_TABLET | Freq: Every day | ORAL | 0 refills | Status: AC

## 2024-06-17 NOTE — ED Provider Notes (Signed)
 [facility] Provider Note    Event Date/Time   First MD Initiated Contact with Patient 06/17/24 1054     (approximate)   History   Chief Complaint Abdominal Pain and Vaginal Bleeding   HPI  Brandi Ford is a 48 y.o. female with past medical history of hypertension, stroke, COPD, and endometriosis who presents to the ED complaining of vaginal bleeding.  Patient reports that she initially began dealing with vaginal bleeding back in May, was referred to an OB/GYN by her PCP at that time and prescribed a course of Aygestin .  She states that bleeding stopped after starting Aygestin  earlier this month, but began again after she completed the 1 week course.  She was restarted on Aygestin  but states that the bleeding has increased over the past 3 days.  She reports having to change a pad once per hour this morning and has been passing clots as well.  She describes crampy pain in her lower abdomen, has not had any nausea, vomiting, or dysuria.     Physical Exam   Triage Vital Signs: ED Triage Vitals  Encounter Vitals Group     BP 06/17/24 1011 (!) 145/84     Girls Systolic BP Percentile --      Girls Diastolic BP Percentile --      Boys Systolic BP Percentile --      Boys Diastolic BP Percentile --      Pulse Rate 06/17/24 1011 86     Resp 06/17/24 1011 20     Temp 06/17/24 1011 98.4 F (36.9 C)     Temp Source 06/17/24 1011 Oral     SpO2 06/17/24 1011 99 %     Weight 06/17/24 1009 250 lb (113.4 kg)     Height 06/17/24 1009 5' 9 (1.753 m)     Head Circumference --      Peak Flow --      Pain Score 06/17/24 1004 8     Pain Loc --      Pain Education --      Exclude from Growth Chart --     Most recent vital signs: Vitals:   06/17/24 1011  BP: (!) 145/84  Pulse: 86  Resp: 20  Temp: 98.4 F (36.9 C)  SpO2: 99%    Constitutional: Alert and oriented. Eyes: Conjunctivae are normal. Head: Atraumatic. Nose: No  congestion/rhinnorhea. Mouth/Throat: Mucous membranes are moist.  Cardiovascular: Normal rate, regular rhythm. Grossly normal heart sounds.  2+ radial pulses bilaterally. Respiratory: Normal respiratory effort.  No retractions. Lungs CTAB. Gastrointestinal: Soft and nontender. No distention. Musculoskeletal: No lower extremity tenderness nor edema.  Neurologic:  Normal speech and language. No gross focal neurologic deficits are appreciated.    ED Results / Procedures / Treatments   Labs (all labs ordered are listed, but only abnormal results are displayed) Labs Reviewed  CBC - Abnormal; Notable for the following components:      Result Value   Hemoglobin 15.3 (*)    All other components within normal limits  COMPREHENSIVE METABOLIC PANEL WITH GFR - Abnormal; Notable for the following components:   Alkaline Phosphatase 36 (*)    All other components within normal limits  LIPASE, BLOOD  ABO/RH    PROCEDURES:  Critical Care performed: No  Procedures   MEDICATIONS ORDERED IN ED: Medications - No data to display   IMPRESSION / MDM / ASSESSMENT AND PLAN / ED COURSE  I reviewed the triage vital signs and the nursing  notes.                              48 y.o. female with past medical history of hypertension, stroke, COPD, and endometriosis who presents to the ED complaining of recurrent vaginal bleeding over the past month.  Patient's presentation is most consistent with acute presentation with potential threat to life or bodily function.  Differential diagnosis includes, but is not limited to, uterine fibroids, endometriosis, malignancy, anemia, electrolyte abnormality, AKI.  Patient nontoxic-appearing and in no acute distress, vital signs are unremarkable.  She has a benign abdominal exam, labs are pending at this time.  She did have an ultrasound earlier this month that was remarkable only for ovarian cyst, do not feel repeat indicated at this time.  Plan to discuss with  OB/GYN following lab results.  Labs are reassuring with hemoglobin stable compared to previous at 15.3, no significant leukocytosis, electrolyte abnormality, or AKI.  LFTs and lipase are unremarkable.  Case discussed with CNM [place] of  OB/GYN, who recommends starting patient on Provera for 10 days with outpatient follow-up.  Prescription provided for this and patient counseled to stop Aygestin .  She was counseled to return to the ED for new or worsening symptoms, patient agrees with plan.      FINAL CLINICAL IMPRESSION(S) / ED DIAGNOSES   Final diagnoses:  Abnormal uterine bleeding (AUB)     Rx / DC Orders   ED Discharge Orders          Ordered    medroxyPROGESTERone (PROVERA) 10 MG tablet  Daily        06/17/24 1228             Note:  This document was prepared using Dragon voice recognition software and may include unintentional dictation errors.   Willo Dunnings, MD 06/17/24 1233

## 2024-06-17 NOTE — ED Triage Notes (Signed)
 Pt to ED for vaginal bleeding since 3 days (is on medication to stop the bleeding) with clots today, bright red. Also lower abdominal pain since 2 days, midline which radiates to RUQ. Also dizzy. Has gone through 3 #2 size pads today.  VS are normal. Skin dry.

## 2024-06-18 ENCOUNTER — Other Ambulatory Visit (HOSPITAL_COMMUNITY)
Admission: RE | Admit: 2024-06-18 | Discharge: 2024-06-18 | Disposition: A | Discharge status: Home / Self Care | Payer: PRIVATE HEALTH INSURANCE | Source: Ambulatory Visit | Attending: Nurse Practitioner | Admitting: Nurse Practitioner

## 2024-06-18 ENCOUNTER — Encounter: Payer: Self-pay | Admitting: Nurse Practitioner

## 2024-06-18 ENCOUNTER — Ambulatory Visit: Payer: PRIVATE HEALTH INSURANCE | Admitting: Nurse Practitioner

## 2024-06-18 VITALS — BP 138/86 | HR 96 | Resp 16 | Ht 69.0 in | Wt 267.1 lb

## 2024-06-18 DIAGNOSIS — N939 Abnormal uterine and vaginal bleeding, unspecified: Secondary | ICD-10-CM | POA: Diagnosis present

## 2024-06-18 DIAGNOSIS — R252 Cramp and spasm: Secondary | ICD-10-CM | POA: Diagnosis not present

## 2024-06-18 LAB — POCT URINALYSIS DIPSTICK
Bilirubin, UA: NEGATIVE
Glucose, UA: NEGATIVE
Ketones, UA: NEGATIVE
Nitrite, UA: NEGATIVE
Protein, UA: NEGATIVE
Spec Grav, UA: 1.025 (ref 1.010–1.025)
Urobilinogen, UA: 0.2 U/dL
pH, UA: 6.5 (ref 5.0–8.0)

## 2024-06-18 LAB — POCT URINE PREGNANCY: Preg Test, Ur: NEGATIVE

## 2024-06-18 MED ORDER — KETOROLAC TROMETHAMINE 60 MG/2ML IM SOLN
60.0000 mg | Freq: Once | INTRAMUSCULAR | Status: AC
  Administered 2024-06-18: 60 mg via INTRAMUSCULAR

## 2024-06-18 NOTE — Progress Notes (Signed)
 BP 138/86   Pulse 96   Resp 16   Ht 5' 9 (1.753 m)   Wt 267 lb 1.6 oz (121.2 kg)   LMP 05/07/2024 (Exact Date)   SpO2 99%   BMI 39.44 kg/m    Subjective:    Patient ID: Brandi Ford, female    DOB: 1976/03/09, 48 y.o.   MRN: [id number]  HPI: Brandi Ford is a 48 y.o. female  Chief Complaint  Patient presents with   ER follow up    Vaginal bleeding since Friday and yesterday was worst day, along with clots    Discussed the use of AI scribe software for clinical note transcription with the patient, who gave verbal consent to proceed.  History of Present Illness Brandi Ford is a 48 year old female who presents with abdominal pain and vaginal bleeding.  She was seen in the emergency department yesterday for abdominal pain and vaginal bleeding. She had been taking Aygestin , but her bleeding had not increased over the past three days. She discontinued Aygestin  and started Provera for ten days. She received one dose of Provera in the emergency room and has since picked up her prescription and started taking it as directed.  She reports significant vaginal bleeding with large blood clots, describing it as 'a whole bunch of blood clots coming out.' The bleeding was heavy on Friday, leading her to call out from work, and continued heavily through the weekend. The bleeding is not as severe today after starting Provera last night.  She experiences leg cramping and headaches, with the headache located above her eye, which she attributes to sinus issues. She is hesitant to take ibuprofen due to the recent heavy bleeding. She has been experiencing dizziness, which she associates with the blood loss, and notes that her blood pressure was slightly elevated since the onset of heavy bleeding.  She has been trying to manage her symptoms by staying hydrated and increasing her potassium intake through foods like bananas and tomato juice, as her potassium levels were borderline low. She also  reports waking up at 4 AM to urinate, which is mostly blood, and feeling unusually hungry in the mornings, possibly due to low energy from blood loss.  She mentions that she had sexual intercourse once prior to the bleeding episode, which was not painful but resulted in bleeding. She is concerned about the possibility of an infection due to wearing pads frequently and reports frequent urination and some discomfort in her back, which she worries might be related to a kidney issue.         06/18/2024   11:22 AM 05/21/2024    1:13 PM 05/13/2022   10:43 AM  Depression screen PHQ 2/9  Decreased Interest 0 0 0  Down, Depressed, Hopeless 0 0 0  PHQ - 2 Score 0 0 0  Altered sleeping 0 0   Tired, decreased energy 0 0   Change in appetite 0 0   Feeling bad or failure about yourself  0 0   Trouble concentrating 0 0   Moving slowly or fidgety/restless 0 0   Suicidal thoughts 0 0   PHQ-9 Score 0 0   Difficult doing work/chores Not difficult at all Not difficult at all     Relevant past medical, surgical, family and social history reviewed and updated as indicated. Interim medical history since our last visit reviewed. Allergies and medications reviewed and updated.  Review of Systems  Ten systems reviewed and is negative except as mentioned  in HPI      Objective:     BP 138/86   Pulse 96   Resp 16   Ht 5' 9 (1.753 m)   Wt 267 lb 1.6 oz (121.2 kg)   LMP 05/07/2024 (Exact Date)   SpO2 99%   BMI 39.44 kg/m    Wt Readings from Last 3 Encounters:  06/18/24 267 lb 1.6 oz (121.2 kg)  06/17/24 250 lb (113.4 kg)  05/22/24 270 lb (122.5 kg)    Physical Exam Physical Exam GENERAL: Alert, cooperative, well developed, no acute distress HEENT: Normocephalic, normal oropharynx, moist mucous membranes CHEST: Clear to auscultation bilaterally, no wheezes, rhonchi, or crackles, lungs normal CARDIOVASCULAR: Normal heart rate and rhythm, S1 and S2 normal without murmurs ABDOMEN: Soft,  non-tender, non-distended, without organomegaly, normal bowel sounds, NO CVA tenderness EXTREMITIES: No cyanosis or edema NEUROLOGICAL: Cranial nerves grossly intact, moves all extremities without gross motor or sensory deficit   Results for orders placed or performed in visit on 06/18/24  POCT urinalysis dipstick   Collection Time: 06/18/24 12:08 PM  Result Value Ref Range   Color, UA Orange/Red    Clarity, UA Cloudy    Glucose, UA Negative Negative   Bilirubin, UA Negative    Ketones, UA Negative    Spec Grav, UA 1.025 1.010 - 1.025   Blood, UA lARGE    pH, UA 6.5 5.0 - 8.0   Protein, UA Negative Negative   Urobilinogen, UA 0.2 0.2 or 1.0 E.U./dL   Nitrite, UA Negative    Leukocytes, UA Large (3+) (A) Negative   Appearance TURBID    Odor foul   POCT urine pregnancy   Collection Time: 06/18/24 12:08 PM  Result Value Ref Range   Preg Test, Ur Negative Negative          Assessment & Plan:   Problem List Items Addressed This Visit   None Visit Diagnoses       Abnormal vaginal bleeding    -  Primary   Relevant Medications   ketorolac  (TORADOL ) injection 60 mg (Completed)   Other Relevant Orders   CBC with Differential/Platelet   Iron, TIBC and Ferritin Panel   Cervicovaginal ancillary only   POCT urinalysis dipstick (Completed)   POCT urine pregnancy (Completed)   Urine Culture   Comprehensive metabolic panel with GFR     Bilateral leg cramps       Relevant Medications   ketorolac  (TORADOL ) injection 60 mg (Completed)   Other Relevant Orders   CBC with Differential/Platelet   Iron, TIBC and Ferritin Panel   Magnesium   Vitamin B12   Comprehensive metabolic panel with GFR        Assessment and Plan Assessment & Plan Abnormal uterine bleeding with ovarian cysts Recent heavy vaginal bleeding with clots, associated with ovarian cysts. Bleeding has decreased since starting Provera 10 mg daily. Pelvic ultrasound confirmed ovarian cysts. - Continue Provera 10  mg daily for 10 days -call GYN for appointment for er follow up - Follow up with GYN on July 16th - Perform vaginal swab to check for infection - Perform pregnancy test  Evaluation for possible pregnancy Possibility of pregnancy considered due to recent sexual activity, despite tubal ligation. Reports heavy bleeding and dizziness. - Perform pregnancy test ( not done in er), has had tubal ligation  Leg cramps Bilateral leg cramps possibly related to borderline potassium levels and recent heavy bleeding. Consuming potassium-rich foods and drinks. - Check magnesium levels - Check iron levels -  Administer Toradol  for pain management  Fatigue Fatigue likely related to recent heavy bleeding and possible iron deficiency. Taking a multivitamin containing iron. - Check iron levels  Headache Headache above the eye, possibly related to sinus issues or dehydration from recent heavy bleeding. Hesitant to take ibuprofen due to recent bleeding. - Administer Toradol  for pain management  Urine dip positive for leukocytes and blood, negative for nitrites will wait for culture, before starting treatment.       Follow up plan: Return if symptoms worsen or fail to improve.

## 2024-06-19 ENCOUNTER — Ambulatory Visit: Payer: Self-pay | Admitting: Nurse Practitioner

## 2024-06-19 DIAGNOSIS — E611 Iron deficiency: Secondary | ICD-10-CM

## 2024-06-19 DIAGNOSIS — B9689 Other specified bacterial agents as the cause of diseases classified elsewhere: Secondary | ICD-10-CM

## 2024-06-19 LAB — URINE CULTURE
MICRO NUMBER:: 16642497
SPECIMEN QUALITY:: ADEQUATE

## 2024-06-19 LAB — CBC WITH DIFFERENTIAL/PLATELET
Absolute Lymphocytes: 2363 {cells}/uL (ref 850–3900)
Absolute Monocytes: 980 {cells}/uL — ABNORMAL HIGH (ref 200–950)
Basophils Absolute: 81 {cells}/uL (ref 0–200)
Basophils Relative: 0.8 %
Eosinophils Absolute: 414 {cells}/uL (ref 15–500)
Eosinophils Relative: 4.1 %
HCT: 48.2 % — ABNORMAL HIGH (ref 35.0–45.0)
Hemoglobin: 15.5 g/dL (ref 11.7–15.5)
MCH: 30.5 pg (ref 27.0–33.0)
MCHC: 32.2 g/dL (ref 32.0–36.0)
MCV: 94.7 fL (ref 80.0–100.0)
MPV: 11.1 fL (ref 7.5–12.5)
Monocytes Relative: 9.7 %
Neutro Abs: 6262 {cells}/uL (ref 1500–7800)
Neutrophils Relative %: 62 %
Platelets: 334 10*3/uL (ref 140–400)
RBC: 5.09 10*6/uL (ref 3.80–5.10)
RDW: 13.1 % (ref 11.0–15.0)
Total Lymphocyte: 23.4 %
WBC: 10.1 10*3/uL (ref 3.8–10.8)

## 2024-06-19 LAB — COMPREHENSIVE METABOLIC PANEL WITH GFR
AG Ratio: 2.2 (calc) (ref 1.0–2.5)
ALT: 18 U/L (ref 6–29)
AST: 12 U/L (ref 10–35)
Albumin: 4.7 g/dL (ref 3.6–5.1)
Alkaline phosphatase (APISO): 38 U/L (ref 31–125)
BUN: 11 mg/dL (ref 7–25)
CO2: 22 mmol/L (ref 20–32)
Calcium: 9.5 mg/dL (ref 8.6–10.2)
Chloride: 106 mmol/L (ref 98–110)
Creat: 0.74 mg/dL (ref 0.50–0.99)
Globulin: 2.1 g/dL (ref 1.9–3.7)
Glucose, Bld: 67 mg/dL (ref 65–99)
Potassium: 4.2 mmol/L (ref 3.5–5.3)
Sodium: 139 mmol/L (ref 135–146)
Total Bilirubin: 0.3 mg/dL (ref 0.2–1.2)
Total Protein: 6.8 g/dL (ref 6.1–8.1)
eGFR: 100 mL/min/{1.73_m2} (ref >=60)

## 2024-06-19 LAB — VITAMIN B12: Vitamin B-12: 401 pg/mL (ref 200–1100)

## 2024-06-19 LAB — IRON,TIBC AND FERRITIN PANEL
%SAT: 22 % (ref 16–45)
Ferritin: 14 ng/mL — ABNORMAL LOW (ref 16–232)
Iron: 70 ng/mL — AB (ref 40–232)
TIBC: 316 ug/dL (ref 250–450)

## 2024-06-19 LAB — MAGNESIUM: Magnesium: 2.3 mg/dL (ref 1.5–2.5)

## 2024-06-19 MED ORDER — IRON (FERROUS SULFATE) 325 (65 FE) MG PO TABS: 325.0000 mg | ORAL_TABLET | Freq: Every day | ORAL | 0 refills | Status: DC

## 2024-06-21 LAB — CERVICOVAGINAL ANCILLARY ONLY
Bacterial Vaginitis (gardnerella): POSITIVE — AB
Candida Glabrata: NEGATIVE
Candida Vaginitis: NEGATIVE
Chlamydia: NEGATIVE
Comment: NEGATIVE
Comment: NEGATIVE
Comment: NEGATIVE
Comment: NEGATIVE
Comment: NEGATIVE
Comment: NORMAL
Neisseria Gonorrhea: NEGATIVE
Trichomonas: NEGATIVE

## 2024-06-21 MED ORDER — METRONIDAZOLE 500 MG PO TABS: 500.0000 mg | ORAL_TABLET | Freq: Two times a day (BID) | ORAL | 0 refills | Status: AC

## 2024-06-27 ENCOUNTER — Telehealth: Payer: Self-pay

## 2024-06-27 NOTE — Telephone Encounter (Signed)
 Pt has appt tomorrow with [facility] for heavy vaginal bleeding. Per [facility] called pt to ask about the bleeding, if still heavy needs to see MD. Has surgery consult next week with Dr. Janit. Pt states appt for tomorrow was advised by ER. She was seen at ER for heavy vaginal bleeding with huge clots. Was given provera  and finished it yesterday. She stopped bleeding last Friday 7/4 and currently not bleeding. She says she is sure by Friday she will start again. All of this vaginal bleeding is giving her vag infections, denies vag sx today. [facility] is advising to cancel appt tomorrow and wait to see Dr. Janit next week. She can also call in progesterone only birth control pills to take daily since bleeding stopped,not as high dose as ones to get bleeding to stop, pt declined Rx. States she will wait for appt next week with Dr. Janit for now.

## 2024-06-28 ENCOUNTER — Ambulatory Visit: Payer: PRIVATE HEALTH INSURANCE | Admitting: Obstetrics and Gynecology

## 2024-07-04 ENCOUNTER — Encounter: Payer: Self-pay | Admitting: Obstetrics and Gynecology

## 2024-07-04 ENCOUNTER — Ambulatory Visit: Payer: PRIVATE HEALTH INSURANCE | Admitting: Obstetrics and Gynecology

## 2024-07-04 VITALS — BP 131/78 | HR 81 | Ht 69.0 in | Wt 269.4 lb

## 2024-07-04 DIAGNOSIS — N83202 Unspecified ovarian cyst, left side: Secondary | ICD-10-CM

## 2024-07-04 DIAGNOSIS — N939 Abnormal uterine and vaginal bleeding, unspecified: Secondary | ICD-10-CM | POA: Diagnosis not present

## 2024-07-04 DIAGNOSIS — N83201 Unspecified ovarian cyst, right side: Secondary | ICD-10-CM | POA: Diagnosis not present

## 2024-07-04 NOTE — Progress Notes (Signed)
 HPI:      [name] is a 47 y.o. [id number] who LMP was Patient's last menstrual period was 05/31/2024 (exact date).  Subjective:   She presents today with complaint of irregular heavy vaginal bleeding and dropping her hemoglobin.  She is currently taking iron  for this.  She has taken short courses of progestin to control her bleeding which has worked.  She describes a history of 4 pelvic/abdominal surgeries.  She states that 1 of these diagnosed endometriosis although I cannot see this op report. She also states that over the last year her periods are somewhat irregular.  She denies other menopausal symptoms. She would like to hear about her options of bleeding pain control.    Hx: The following portions of the patient's history were reviewed and updated as appropriate:             She  has a past medical history of Endometriosis, High blood pressure, Ovarian cyst, and Stroke (HCC) (2009). She does not have any pertinent problems on file. She  has a past surgical history that includes Cholecystectomy; Cesarean section; Tonsillectomy; and Tubal ligation (2010). Her family history includes Breast cancer (age of onset: 105) in her mother; Uterine cancer in her maternal grandmother. She  reports that she has been smoking cigarettes. She has never used smokeless tobacco. She reports current alcohol use. She reports that she does not use drugs. She has a current medication list which includes the following prescription(s): albuterol, iron  (ferrous sulfate ), multivitamin, and medroxyprogesterone . She is allergic to aspirin, claritin [loratadine], dilaudid [hydromorphone], egg-derived products, norco [hydrocodone-acetaminophen ], peanut (diagnostic), and yeast-derived drug products.       Review of Systems:  Review of Systems  Constitutional: Denied constitutional symptoms, night sweats, recent illness, fatigue, fever, insomnia and weight loss.  Eyes: Denied eye symptoms, eye pain, photophobia,  vision change and visual disturbance.  Ears/Nose/Throat/Neck: Denied ear, nose, throat or neck symptoms, hearing loss, nasal discharge, sinus congestion and sore throat.  Cardiovascular: Denied cardiovascular symptoms, arrhythmia, chest pain/pressure, edema, exercise intolerance, orthopnea and palpitations.  Respiratory: Denied pulmonary symptoms, asthma, pleuritic pain, productive sputum, cough, dyspnea and wheezing.  Gastrointestinal: Denied, gastro-esophageal reflux, melena, nausea and vomiting.  Genitourinary: See HPI for additional information.  Musculoskeletal: Denied musculoskeletal symptoms, stiffness, swelling, muscle weakness and myalgia.  Dermatologic: Denied dermatology symptoms, rash and scar.  Neurologic: Denied neurology symptoms, dizziness, headache, neck pain and syncope.  Psychiatric: Denied psychiatric symptoms, anxiety and depression.  Endocrine: Denied endocrine symptoms including hot flashes and night sweats.   Meds:   Current Outpatient Medications on File Prior to Visit  Medication Sig Dispense Refill   ALBUTEROL IN Inhale into the lungs as needed.     Iron , Ferrous Sulfate , 325 (65 Fe) MG TABS Take 325 mg by mouth daily. 30 tablet 0   Multiple Vitamin (MULTIVITAMIN) tablet Take 1 tablet by mouth daily.     medroxyPROGESTERone  (PROVERA ) 10 MG tablet Take 1 tablet (10 mg total) by mouth daily for 10 days. (Patient not taking: Reported on 07/04/2024) 10 tablet 0   No current facility-administered medications on file prior to visit.      Objective:     Vitals:   07/04/24 0839  BP: 131/78  Pulse: 81   Filed Weights   07/04/24 0839  Weight: 269 lb 6.4 oz (122.2 kg)    Ultrasound results reviewed directly with the patient  Assessment:    [id number] Patient Active Problem List   Diagnosis Date Noted   Essential hypertension 05/13/2022   History of CVA (cerebrovascular accident) without residual deficits 05/13/2022   Mild intermittent  asthma 05/13/2022   Chronic obstructive pulmonary disease (HCC) 05/13/2022   Cigarette nicotine dependence with nicotine-induced disorder 05/13/2022   H/O multiple allergies 05/13/2022   Endometriosis 05/13/2022     1. Abnormal uterine bleeding (AUB)   2. Cysts of both ovaries     I believe both cyst of the ovary are benign and of no consequence at this time. Based on review of her ultrasound I think it is possible that she has adenomyosis.  I am encouraged that her bleeding has been controlled with Provera .  I am also encouraged by the fact that she has had irregular cycles because it may indicate that she is nearing menopause.  Of course menopause will help with adenomyosis and endometriosis.   Plan:            1.  We have discussed multiple options for cycle control.  These include mostly progesterone agents.  We have talked about agents like Myfembree that could induce a false menopause and help control bleeding.  We have also talked about the possibility of IUD containing progestin and its direct effect on the uterine lining and possibly adenomyosis.  Hysterectomy was discussed but in light of her 4 previous surgeries and history of endometriosis there is somewhat of an increased risk based on the possibility of pelvic adhesions and distorted anatomy.  The strategy of cycle control until she reaches menopause was specifically addressed. 2.  FSH today 3.  Patient to have an IUD placed during her menses.  She would like to give it a 3 to 19-month trial to see if it helps with her bleeding and pelvic pain. Orders Orders Placed This Encounter  Procedures   FSH    No orders of the defined types were placed in this encounter.     F/U  Return for She is to call at the start of next menses.  Alm DOROTHA Sar, M.D. 07/04/2024 10:00 AM

## 2024-07-04 NOTE — Progress Notes (Signed)
 Patient presents today to discuss surgical options due to ongoing abnormal bleeding. Reports abnormal bleeding for years but skipping cycles starting in May along with abdominal pain. Ultimately patient would like a hysterectomy at this time.

## 2024-07-05 ENCOUNTER — Telehealth: Payer: Self-pay

## 2024-07-05 DIAGNOSIS — Z1231 Encounter for screening mammogram for malignant neoplasm of breast: Secondary | ICD-10-CM

## 2024-07-05 LAB — FOLLICLE STIMULATING HORMONE: FSH: 9.1 m[IU]/mL

## 2024-07-05 NOTE — Telephone Encounter (Signed)
 Copied from CRM [id number]. Topic: Appointments - Appointment Scheduling >> Jul 04, 2024  4:15 PM Travis F wrote: Patient is calling in because she would like to schedule a mammogram. Please follow up with patient.

## 2024-07-05 NOTE — Telephone Encounter (Signed)
Ok to order mammogram 

## 2024-07-05 NOTE — Addendum Note (Signed)
 Addended by: RENTERIA-GARCIA, Blondell Laperle on: 07/05/2024 09:15 AM   Modules accepted: Orders

## 2024-07-05 NOTE — Telephone Encounter (Signed)
 Order placed sent pt MyChart message

## 2024-07-06 ENCOUNTER — Encounter: Payer: Self-pay | Admitting: Nurse Practitioner

## 2024-07-06 ENCOUNTER — Ambulatory Visit (INDEPENDENT_AMBULATORY_CARE_PROVIDER_SITE_OTHER): Payer: PRIVATE HEALTH INSURANCE | Admitting: Nurse Practitioner

## 2024-07-06 VITALS — BP 126/78 | HR 81 | Temp 98.2°F | Resp 18 | Ht 68.0 in | Wt 268.9 lb

## 2024-07-06 DIAGNOSIS — Z1211 Encounter for screening for malignant neoplasm of colon: Secondary | ICD-10-CM

## 2024-07-06 DIAGNOSIS — Z803 Family history of malignant neoplasm of breast: Secondary | ICD-10-CM

## 2024-07-06 DIAGNOSIS — N644 Mastodynia: Secondary | ICD-10-CM | POA: Diagnosis not present

## 2024-07-06 DIAGNOSIS — Z0000 Encounter for general adult medical examination without abnormal findings: Secondary | ICD-10-CM | POA: Diagnosis not present

## 2024-07-06 DIAGNOSIS — Z1231 Encounter for screening mammogram for malignant neoplasm of breast: Secondary | ICD-10-CM | POA: Diagnosis not present

## 2024-07-06 DIAGNOSIS — N6314 Unspecified lump in the right breast, lower inner quadrant: Secondary | ICD-10-CM

## 2024-07-06 DIAGNOSIS — N6489 Other specified disorders of breast: Secondary | ICD-10-CM

## 2024-07-06 NOTE — Progress Notes (Unsigned)
 Name: Brandi Ford   MRN: [id number]    DOB: 1976/11/26   Date:07/08/2024       Progress Note  Subjective  Chief Complaint  Chief Complaint  Patient presents with   Annual Exam    HPI  Patient presents for annual CPE.  Discussed the use of AI scribe software for clinical note transcription with the patient, who gave verbal consent to proceed.  History of Present Illness     Diet: well balanced diet Exercise: walking  Sleep: sleeping well 8 hours Last dental exam:long time Last eye exam: 2025  Flowsheet Row Office Visit from 05/13/2022 in [facility]  AUDIT-C Score 0   Depression: Phq 9 is  negative    06/18/2024   11:22 AM 05/21/2024    1:13 PM 05/13/2022   10:43 AM  Depression screen PHQ 2/9  Decreased Interest 0 0 0  Down, Depressed, Hopeless 0 0 0  PHQ - 2 Score 0 0 0  Altered sleeping 0 0   Tired, decreased energy 0 0   Change in appetite 0 0   Feeling bad or failure about yourself  0 0   Trouble concentrating 0 0   Moving slowly or fidgety/restless 0 0   Suicidal thoughts 0 0   PHQ-9 Score 0 0   Difficult doing work/chores Not difficult at all Not difficult at all    Hypertension: BP Readings from Last 3 Encounters:  07/06/24 126/78  07/04/24 131/78  06/18/24 138/86   Obesity: Wt Readings from Last 3 Encounters:  07/06/24 268 lb 14.4 oz (122 kg)  07/04/24 269 lb 6.4 oz (122.2 kg)  06/18/24 267 lb 1.6 oz (121.2 kg)   BMI Readings from Last 3 Encounters:  07/06/24 40.89 kg/m  07/04/24 39.78 kg/m  06/18/24 39.44 kg/m     Vaccines:  HPV: up to at age 82 , ask insurance if age between 23-45  Shingrix: 2-64 yo and ask insurance if covered when patient above 79 yo Pneumonia:  educated and discussed with patient. Flu:  educated and discussed with patient.  Hep C Screening: completed STD testing and prevention (HIV/chl/gon/syphilis): completed Intimate partner violence:none Sexual History : sexually active Menstrual  History/LMP/Abnormal Bleeding: working with GYN due to abnormal vaginal bleeding Incontinence Symptoms: none  Breast cancer:  - Last Mammogram: due, ordered - BRCA gene screening: none  Osteoporosis: Discussed high calcium and vitamin D supplementation, weight bearing exercises  Cervical cancer screening: 05/22/2024  Skin cancer: Discussed monitoring for atypical lesions  Colorectal cancer: due, ordered   Lung cancer:   Low Dose CT Chest recommended if Age 30-80 years, 20 pack-year currently smoking OR have quit w/in 15years. Patient does not qualify.   ECG: 04/05/2022  Advanced Care Planning: A voluntary discussion about advance care planning including the explanation and discussion of advance directives.  Discussed health care proxy and Living will, and the patient was able to identify a health care proxy as sister.  Patient does not have a living will at present time. If patient does have living will, I have requested they bring this to the clinic to be scanned in to their chart.  Lipids: Lab Results  Component Value Date   CHOL 202 (H) 05/13/2022   Lab Results  Component Value Date   HDL 54 05/13/2022   Lab Results  Component Value Date   LDLCALC 107 (H) 05/13/2022   Lab Results  Component Value Date   TRIG 286 (H) 05/13/2022   Lab Results  Component Value Date   CHOLHDL 3.7 05/13/2022   No results found for: LDLDIRECT  Glucose: Glucose, Bld  Date Value Ref Range Status  06/18/2024 67 65 - 99 mg/dL Final    Comment:    .            Fasting reference interval .   06/17/2024 95 70 - 99 mg/dL Final    Comment:    Glucose reference range applies only to samples taken after fasting for at least 8 hours.  05/13/2022 95 65 - 99 mg/dL Final    Comment:    .            Fasting reference interval .     Patient Active Problem List   Diagnosis Date Noted   Essential hypertension 05/13/2022   History of CVA (cerebrovascular accident) without residual deficits  05/13/2022   Mild intermittent asthma 05/13/2022   Chronic obstructive pulmonary disease (HCC) 05/13/2022   Cigarette nicotine dependence with nicotine-induced disorder 05/13/2022   H/O multiple allergies 05/13/2022   Endometriosis 05/13/2022    Past Surgical History:  Procedure Laterality Date   CESAREAN SECTION     2002 and 2006   CHOLECYSTECTOMY     TONSILLECTOMY     TUBAL LIGATION  2010    Family History  Problem Relation Age of Onset   Breast cancer Mother 46   Uterine cancer Maternal Grandmother        unknown age    Social History   Socioeconomic History   Marital status: Married    Spouse name: Not on file   Number of children: Not on file   Years of education: Not on file   Highest education level: Not on file  Occupational History   Not on file  Tobacco Use   Smoking status: Every Day    Current packs/day: 0.50    Types: Cigarettes   Smokeless tobacco: Never  Vaping Use   Vaping status: Never Used  Substance and Sexual Activity   Alcohol use: Yes    Comment: very rare   Drug use: Never   Sexual activity: Yes    Partners: Female    Birth control/protection: Surgical    Comment: Tubal Ligation  Other Topics Concern   Not on file  Social History Narrative   Moved from [place]    Social Drivers of Health   Financial Resource Strain: Low Risk  (07/06/2024)   Overall Financial Resource Strain (CARDIA)    Difficulty of Paying Living Expenses: Not hard at all  Food Insecurity: No Food Insecurity (07/06/2024)   Hunger Vital Sign    Worried About Running Out of Food in the Last Year: Never true    Ran Out of Food in the Last Year: Never true  Transportation Needs: No Transportation Needs (07/06/2024)   PRAPARE - Administrator, Civil Service (Medical): No    Lack of Transportation (Non-Medical): No  Physical Activity: Inactive (07/06/2024)   Exercise Vital Sign    Days of Exercise per Week: 0 days    Minutes of Exercise per Session: 0 min   Stress: No Stress Concern Present (07/06/2024)   [employer] of Occupational Health - Occupational Stress Questionnaire    Feeling of Stress: Not at all  Social Connections: Not on file  Intimate Partner Violence: Not At Risk (07/06/2024)   Humiliation, Afraid, Rape, and Kick questionnaire    Fear of Current or Ex-Partner: No    Emotionally Abused: No    Physically  Abused: No    Sexually Abused: No     Current Outpatient Medications:    ALBUTEROL IN, Inhale into the lungs as needed., Disp: , Rfl:    Iron , Ferrous Sulfate , 325 (65 Fe) MG TABS, Take 325 mg by mouth daily., Disp: 30 tablet, Rfl: 0   medroxyPROGESTERone  (PROVERA ) 10 MG tablet, Take 1 tablet (10 mg total) by mouth daily for 10 days. (Patient not taking: Reported on 07/04/2024), Disp: 10 tablet, Rfl: 0   Multiple Vitamin (MULTIVITAMIN) tablet, Take 1 tablet by mouth daily., Disp: , Rfl:   Allergies  Allergen Reactions   Aspirin    Claritin [Loratadine]    Dilaudid [Hydromorphone]    Egg-Derived Products    Norco [Hydrocodone-Acetaminophen ]    Peanut (Diagnostic)    Yeast-Derived Drug Products      ROS  Constitutional: Negative for fever or weight change.  Respiratory: Negative for cough and shortness of breath.   Cardiovascular: Negative for chest pain or palpitations.  Gastrointestinal: Negative for abdominal pain, no bowel changes.  Musculoskeletal: Negative for gait problem or joint swelling.  Skin: Negative for rash.  Neurological: Negative for dizziness or headache.  No other specific complaints in a complete review of systems (except as listed in HPI above).   Objective  Vitals:   07/06/24 1102  BP: 126/78  Pulse: 81  Resp: 18  Temp: 98.2 F (36.8 C)  SpO2: 96%  Weight: 268 lb 14.4 oz (122 kg)  Height: 5' 8 (1.727 m)    Body mass index is 40.89 kg/m.  Physical Exam Vitals reviewed.  Constitutional:      Appearance: Normal appearance.  HENT:     Head: Normocephalic.     Right Ear:  Tympanic membrane normal.     Left Ear: Tympanic membrane normal.     Nose: Nose normal.  Eyes:     Extraocular Movements: Extraocular movements intact.     Conjunctiva/sclera: Conjunctivae normal.     Pupils: Pupils are equal, round, and reactive to light.  Neck:     Thyroid: No thyroid mass, thyromegaly or thyroid tenderness.  Cardiovascular:     Rate and Rhythm: Normal rate and regular rhythm.     Pulses: Normal pulses.     Heart sounds: Normal heart sounds.  Pulmonary:     Effort: Pulmonary effort is normal.     Breath sounds: Normal breath sounds.  Chest:  Breasts:    Right: Mass and tenderness present.     Left: Swelling and tenderness present.    Abdominal:     General: Bowel sounds are normal.     Palpations: Abdomen is soft.  Musculoskeletal:        General: Normal range of motion.     Cervical back: Normal range of motion and neck supple.     Right lower leg: No edema.     Left lower leg: No edema.  Skin:    General: Skin is warm and dry.     Capillary Refill: Capillary refill takes less than 2 seconds.  Neurological:     General: No focal deficit present.     Mental Status: She is alert and oriented to person, place, and time. Mental status is at baseline.  Psychiatric:        Mood and Affect: Mood normal.        Behavior: Behavior normal.        Thought Content: Thought content normal.        Judgment: Judgment normal.  Recent Results (from the past 2160 hours)  Cervicovaginal ancillary only     Status: Abnormal   Collection Time: 05/21/24  1:27 PM  Result Value Ref Range   Neisseria Gonorrhea Negative    Chlamydia Negative    Trichomonas Negative    Bacterial Vaginitis (gardnerella) Positive (A)    Candida Vaginitis Negative    Candida Glabrata Negative    Comment      Normal Reference Range Bacterial Vaginosis - Negative   Comment Normal Reference Range Candida Species - Negative    Comment Normal Reference Range Candida Galbrata - Negative     Comment Normal Reference Range Trichomonas - Negative    Comment Normal Reference Ranger Chlamydia - Negative    Comment      Normal Reference Range Neisseria Gonorrhea - Negative  CBC with Differential/Platelet     Status: Abnormal   Collection Time: 05/21/24  1:42 PM  Result Value Ref Range   WBC 10.0 3.8 - 10.8 Thousand/uL   RBC 4.91 3.80 - 5.10 Million/uL   Hemoglobin 15.0 11.7 - 15.5 g/dL   HCT 54.5 (H) 64.9 - 54.9 %   MCV 92.5 80.0 - 100.0 fL   MCH 30.5 27.0 - 33.0 pg   MCHC 33.0 32.0 - 36.0 g/dL    Comment: For adults, a slight decrease in the calculated MCHC value (in the range of 30 to 32 g/dL) is most likely not clinically significant; however, it should be interpreted with caution in correlation with other red cell parameters and the patient's clinical condition.    RDW 12.8 11.0 - 15.0 %   Platelets 317 140 - 400 Thousand/uL   MPV 11.5 7.5 - 12.5 fL   Neutro Abs 6,330 1,500 - 7,800 cells/uL   Absolute Lymphocytes 2,340 850 - 3,900 cells/uL   Absolute Monocytes 880 200 - 950 cells/uL   Eosinophils Absolute 390 15 - 500 cells/uL   Basophils Absolute 60 0 - 200 cells/uL   Neutrophils Relative % 63.3 %   Total Lymphocyte 23.4 %   Monocytes Relative 8.8 %   Eosinophils Relative 3.9 %   Basophils Relative 0.6 %  TSH     Status: None   Collection Time: 05/21/24  1:42 PM  Result Value Ref Range   TSH 0.99 mIU/L    Comment:           Reference Range .           > or = 20 Years  0.40-4.50 .                Pregnancy Ranges           First trimester    0.26-2.66           Second trimester   0.55-2.73           Third trimester    0.43-2.91   FSH/LH     Status: None   Collection Time: 05/21/24  1:42 PM  Result Value Ref Range   FSH 9.0 mIU/mL    Comment:                     Reference Range .              Follicular Phase       2.5-10.2              Mid-cycle Peak         3.1-17.7  Luteal Phase           1.5- 9.1              Postmenopausal        23.0-116.3              .    LH 4.5 mIU/mL    Comment:     Reference Range Follicular Phase  1.9-12.5 Mid-Cycle Peak    8.7-76.3 Luteal Phase      0.5-16.9 Postmenopausal    10.0-54.7   POCT urine pregnancy     Status: None   Collection Time: 05/21/24  1:42 PM  Result Value Ref Range   Preg Test, Ur Negative Negative  POCT urinalysis dipstick     Status: Abnormal   Collection Time: 05/21/24  1:42 PM  Result Value Ref Range   Color, UA yellow    Clarity, UA clear    Glucose, UA Negative Negative   Bilirubin, UA neg    Ketones, UA neg    Spec Grav, UA <=1.005 (A) 1.010 - 1.025   Blood, UA pos    pH, UA 6.5 5.0 - 8.0   Protein, UA Negative Negative   Urobilinogen, UA 0.2 0.2 or 1.0 E.U./dL   Nitrite, UA neg    Leukocytes, UA Negative Negative   Appearance clear    Odor normal   Estradiol      Status: None   Collection Time: 05/21/24  1:42 PM  Result Value Ref Range   Estradiol  87 pg/mL    Comment:       Reference Range         Follicular Phase:    19-144         Mid-Cycle:           64-357         Luteal Phase:        56-214         Postmenopausal:      < or = 31 . Reference range established on post-pubertal patient population. No pre-pubertal reference range established using this assay. For any patients for whom low Estradiol  levels are anticipated (e.g. males, pre-pubertal children and hypogonadal/post-menopausal  females), the [facility] Estradiol , Ultrasensitive, LCMSMS assay is recommended (order code 69710). . Please note: patients being treated with the drug  fulvestrant (Faslodex(R)) have demonstrated significant  interference in immunoassay methods for estradiol   measurement. The cross reactivity could lead to falsely  elevated estradiol  test results leading to an  inappropriate clinical assessment of estrogen status. [facility] order code 30289-Estradiol ,  Ultrasensitive LC/MS/MS demonstrates negligible cross  re  activity with fulvestrant.   Prolactin     Status: None   Collection Time: 05/21/24  1:42 PM  Result Value Ref Range   Prolactin 9.2 ng/mL    Comment:             Reference Range  Females         Non-pregnant        3.0-30.0         Pregnant           10.0-209.0         Postmenopausal      2.0-20.0 . . .   Cytology - PAP     Status: None   Collection Time: 05/22/24 10:46 AM  Result Value Ref Range   High risk HPV Negative    Adequacy      Satisfactory for evaluation; transformation zone component ABSENT.  Diagnosis      - Negative for intraepithelial lesion or malignancy (NILM)   Comment      Endometrial cells are present.  Clinical correlation and correlation   Comment with the LMP is recommended.    Comment Normal Reference Range HPV - Negative   CBC     Status: Abnormal   Collection Time: 06/17/24 11:23 AM  Result Value Ref Range   WBC 9.4 4.0 - 10.5 K/uL   RBC 4.97 3.87 - 5.11 MIL/uL   Hemoglobin 15.3 (H) 12.0 - 15.0 g/dL   HCT 54.8 63.9 - 53.9 %   MCV 90.7 80.0 - 100.0 fL   MCH 30.8 26.0 - 34.0 pg   MCHC 33.9 30.0 - 36.0 g/dL   RDW 87.1 88.4 - 84.4 %   Platelets 293 150 - 400 K/uL   nRBC 0.0 0.0 - 0.2 %    Comment: Performed at [facility], [address], [place], [place] [address]  Comprehensive metabolic panel     Status: Abnormal   Collection Time: 06/17/24 11:23 AM  Result Value Ref Range   Sodium 140 135 - 145 mmol/L   Potassium 3.5 3.5 - 5.1 mmol/L   Chloride 109 98 - 111 mmol/L   CO2 22 22 - 32 mmol/L   Glucose, Bld 95 70 - 99 mg/dL    Comment: Glucose reference range applies only to samples taken after fasting for at least 8 hours.   BUN 12 6 - 20 mg/dL   Creatinine, Ser 9.30 0.44 - 1.00 mg/dL   Calcium 9.5 8.9 - 89.6 mg/dL   Total Protein 7.3 6.5 - 8.1 g/dL   Albumin 4.2 3.5 - 5.0 g/dL   AST 16 15 - 41 U/L   ALT 19 0 - 44 U/L   Alkaline Phosphatase 36 (L) 38 - 126 U/L   Total Bilirubin 0.5 0.0 - 1.2 mg/dL   GFR, Estimated >39 >39  mL/min    Comment: (NOTE) Calculated using the CKD-EPI Creatinine Equation (2021)    Anion gap 9 5 - 15    Comment: Performed at [facility], [address] [place], [place] [address]  Lipase, blood     Status: None   Collection Time: 06/17/24 11:23 AM  Result Value Ref Range   Lipase 32 11 - 51 U/L    Comment: Performed at [facility], [address] [place], [place] [address]  ABO/Rh     Status: None   Collection Time: 06/17/24 11:23 AM  Result Value Ref Range   ABO/RH(D)      A POS Performed at [facility], [address] [place], [place] [address]   Cervicovaginal ancillary only     Status: Abnormal   Collection Time: 06/18/24 11:52 AM  Result Value Ref Range   Neisseria Gonorrhea Negative    Chlamydia Negative    Trichomonas Negative    Bacterial Vaginitis (gardnerella) Positive (A)    Candida Vaginitis Negative    Candida Glabrata Negative    Comment Normal Reference Range Candida Species - Negative    Comment Normal Reference Range Candida Galbrata - Negative    Comment Normal Reference Range Trichomonas - Negative    Comment Normal Reference Ranger Chlamydia - Negative    Comment      Normal Reference Range Neisseria Gonorrhea - Negative   Comment      Normal Reference Range Bacterial Vaginosis - Negative  CBC with Differential/Platelet     Status: Abnormal  Collection Time: 06/18/24 12:08 PM  Result Value Ref Range   WBC 10.1 3.8 - 10.8 Thousand/uL   RBC 5.09 3.80 - 5.10 Million/uL   Hemoglobin 15.5 11.7 - 15.5 g/dL   HCT 51.7 (H) 64.9 - 54.9 %   MCV 94.7 80.0 - 100.0 fL   MCH 30.5 27.0 - 33.0 pg   MCHC 32.2 32.0 - 36.0 g/dL    Comment: For adults, a slight decrease in the calculated MCHC value (in the range of 30 to 32 g/dL) is most likely not clinically significant; however, it should be interpreted with caution in correlation with other red cell parameters and the patient's clinical condition.    RDW 13.1 11.0 - 15.0  %   Platelets 334 140 - 400 Thousand/uL   MPV 11.1 7.5 - 12.5 fL   Neutro Abs 6,262 1,500 - 7,800 cells/uL   Absolute Lymphocytes 2,363 850 - 3,900 cells/uL   Absolute Monocytes 980 (H) 200 - 950 cells/uL   Eosinophils Absolute 414 15 - 500 cells/uL   Basophils Absolute 81 0 - 200 cells/uL   Neutrophils Relative % 62 %   Total Lymphocyte 23.4 %   Monocytes Relative 9.7 %   Eosinophils Relative 4.1 %   Basophils Relative 0.8 %  Iron , TIBC and Ferritin Panel     Status: Abnormal   Collection Time: 06/18/24 12:08 PM  Result Value Ref Range   Iron  70 40 - 190 mcg/dL   TIBC 683 749 - 549 mcg/dL (calc)   %SAT 22 16 - 45 % (calc)   Ferritin 14 (L) 16 - 232 ng/mL  Magnesium     Status: None   Collection Time: 06/18/24 12:08 PM  Result Value Ref Range   Magnesium 2.3 1.5 - 2.5 mg/dL  Vitamin B12     Status: None   Collection Time: 06/18/24 12:08 PM  Result Value Ref Range   Vitamin B-12 401 200 - 1,100 pg/mL  POCT urinalysis dipstick     Status: Abnormal   Collection Time: 06/18/24 12:08 PM  Result Value Ref Range   Color, UA Orange/Red    Clarity, UA Cloudy    Glucose, UA Negative Negative   Bilirubin, UA Negative    Ketones, UA Negative    Spec Grav, UA 1.025 1.010 - 1.025   Blood, UA lARGE    pH, UA 6.5 5.0 - 8.0   Protein, UA Negative Negative   Urobilinogen, UA 0.2 0.2 or 1.0 E.U./dL   Nitrite, UA Negative    Leukocytes, UA Large (3+) (A) Negative   Appearance TURBID    Odor foul   POCT urine pregnancy     Status: None   Collection Time: 06/18/24 12:08 PM  Result Value Ref Range   Preg Test, Ur Negative Negative  Urine Culture     Status: None   Collection Time: 06/18/24 12:08 PM   Specimen: Urine  Result Value Ref Range   MICRO NUMBER: [id number]    SPECIMEN QUALITY: Adequate    Sample Source URINE    STATUS: FINAL    Result:      Mixed genital flora isolated. These superficial bacteria are not indicative of a urinary tract infection. No further organism  identification is warranted on this specimen. If clinically indicated, recollect clean-catch, mid-stream urine and transfer  immediately to Urine Culture Transport Tube.   Comprehensive metabolic panel with GFR     Status: None   Collection Time: 06/18/24 12:08 PM  Result Value Ref Range  Glucose, Bld 67 65 - 99 mg/dL    Comment: .            Fasting reference interval .    BUN 11 7 - 25 mg/dL   Creat 9.25 9.49 - 9.00 mg/dL   eGFR 899 > OR = 60 fO/fpw/8.26f7   BUN/Creatinine Ratio SEE NOTE: 6 - 22 (calc)    Comment:    Not Reported: BUN and Creatinine are within    reference range. .    Sodium 139 135 - 146 mmol/L   Potassium 4.2 3.5 - 5.3 mmol/L   Chloride 106 98 - 110 mmol/L   CO2 22 20 - 32 mmol/L   Calcium 9.5 8.6 - 10.2 mg/dL   Total Protein 6.8 6.1 - 8.1 g/dL   Albumin 4.7 3.6 - 5.1 g/dL   Globulin 2.1 1.9 - 3.7 g/dL (calc)   AG Ratio 2.2 1.0 - 2.5 (calc)   Total Bilirubin 0.3 0.2 - 1.2 mg/dL   Alkaline phosphatase (APISO) 38 31 - 125 U/L   AST 12 10 - 35 U/L   ALT 18 6 - 29 U/L  FSH     Status: None   Collection Time: 07/04/24 10:03 AM  Result Value Ref Range   FSH 9.1 mIU/mL    Comment:                      Adult Female             Range                       Follicular phase      3.5 -  12.5                       Ovulation phase       4.7 -  21.5                       Luteal phase          1.7 -   7.7                       Postmenopausal       25.8 - 134.8       Fall Risk:    06/18/2024   11:22 AM 05/21/2024    1:13 PM 05/13/2022   10:42 AM  Fall Risk   Falls in the past year? 0 0 0  Number falls in past yr: 0 0 0  Injury with Fall? 0 0 0  Risk for fall due to : No Fall Risks No Fall Risks   Follow up Falls prevention discussed;Education provided;Falls evaluation completed Falls evaluation completed Falls evaluation completed      Data saved with a previous flowsheet row definition     Functional Status Survey: Is the patient deaf or have  difficulty hearing?: No Does the patient have difficulty seeing, even when wearing glasses/contacts?: No Does the patient have difficulty concentrating, remembering, or making decisions?: No Does the patient have difficulty walking or climbing stairs?: No Does the patient have difficulty dressing or bathing?: No Does the patient have difficulty doing errands alone such as visiting a doctor's office or shopping?: No   Assessment & Plan  Problem List Items Addressed This Visit   None Visit Diagnoses       Annual physical exam    -  Primary     Encounter for screening mammogram for malignant neoplasm of breast         Screening for colon cancer       Relevant Orders   Ambulatory referral to Gastroenterology     Breast tenderness in female       Relevant Orders   MM 3D DIAGNOSTIC MAMMOGRAM BILATERAL BREAST   US  LIMITED ULTRASOUND INCLUDING AXILLA LEFT BREAST    US  LIMITED ULTRASOUND INCLUDING AXILLA RIGHT BREAST     Breast asymmetry       Relevant Orders   MM 3D DIAGNOSTIC MAMMOGRAM BILATERAL BREAST   US  LIMITED ULTRASOUND INCLUDING AXILLA LEFT BREAST    US  LIMITED ULTRASOUND INCLUDING AXILLA RIGHT BREAST     Family history of breast cancer       Relevant Orders   MM 3D DIAGNOSTIC MAMMOGRAM BILATERAL BREAST   US  LIMITED ULTRASOUND INCLUDING AXILLA LEFT BREAST    US  LIMITED ULTRASOUND INCLUDING AXILLA RIGHT BREAST     Mass of lower inner quadrant of right breast       Relevant Orders   MM 3D DIAGNOSTIC MAMMOGRAM BILATERAL BREAST   US  LIMITED ULTRASOUND INCLUDING AXILLA RIGHT BREAST      Assessment and Plan Assessment & Plan      -USPSTF grade A and B recommendations reviewed with patient; age-appropriate recommendations, preventive care, screening tests, etc discussed and encouraged; healthy living encouraged; see AVS for patient education given to patient -Discussed importance of 150 minutes of physical activity weekly, eat two servings of fish weekly, eat one serving of  tree nuts ( cashews, pistachios, pecans, almonds.SABRA) every other day, eat 6 servings of fruit/vegetables daily and drink plenty of water and avoid sweet beverages.   -Reviewed Health Maintenance: yes

## 2024-07-11 ENCOUNTER — Other Ambulatory Visit: Payer: Self-pay | Admitting: Nurse Practitioner

## 2024-07-11 DIAGNOSIS — E611 Iron deficiency: Secondary | ICD-10-CM

## 2024-07-12 ENCOUNTER — Other Ambulatory Visit: Payer: Self-pay | Admitting: Nurse Practitioner

## 2024-07-12 ENCOUNTER — Inpatient Hospital Stay
Admission: RE | Admit: 2024-07-12 | Discharge: 2024-07-12 | Disposition: A | Discharge status: Home / Self Care | Payer: Self-pay | Source: Ambulatory Visit | Attending: Nurse Practitioner | Admitting: Nurse Practitioner

## 2024-07-12 ENCOUNTER — Other Ambulatory Visit: Payer: Self-pay | Admitting: *Deleted

## 2024-07-12 DIAGNOSIS — Z1231 Encounter for screening mammogram for malignant neoplasm of breast: Secondary | ICD-10-CM

## 2024-07-12 DIAGNOSIS — Z803 Family history of malignant neoplasm of breast: Secondary | ICD-10-CM

## 2024-07-12 DIAGNOSIS — N644 Mastodynia: Secondary | ICD-10-CM

## 2024-07-12 DIAGNOSIS — N6489 Other specified disorders of breast: Secondary | ICD-10-CM

## 2024-07-13 NOTE — Telephone Encounter (Signed)
 Requested Prescriptions  Pending Prescriptions Disp Refills   ferrous sulfate  325 (65 FE) MG tablet [Pharmacy Med Name: FERROUS SULFATE  325 MG TABLET] 90 tablet 1    Sig: TAKE 1 TABLET BY MOUTH EVERY DAY     Endocrinology:  Minerals - Iron  Supplementation Failed - 07/13/2024  9:56 AM      Failed - HCT in normal range and within 360 days    HCT  Date Value Ref Range Status  06/18/2024 48.2 (H) 35.0 - 45.0 % Final         Failed - Ferritin in normal range and within 360 days    Ferritin  Date Value Ref Range Status  06/18/2024 14 (L) 16 - 232 ng/mL Final         Passed - HGB in normal range and within 360 days    Hemoglobin  Date Value Ref Range Status  06/18/2024 15.5 11.7 - 15.5 g/dL Final         Passed - RBC in normal range and within 360 days    RBC  Date Value Ref Range Status  06/18/2024 5.09 3.80 - 5.10 Million/uL Final         Passed - Fe (serum) in normal range and within 360 days    Iron   Date Value Ref Range Status  06/18/2024 70 40 - 190 mcg/dL Final   %SAT  Date Value Ref Range Status  06/18/2024 22 16 - 45 % (calc) Final         Passed - Valid encounter within last 12 months    Recent Outpatient Visits           1 week ago Annual physical exam   [facility] Gareth Mliss [place], FNP   3 weeks ago Abnormal vaginal bleeding   [facility] Gareth Mliss [place], FNP   1 month ago Abnormal vaginal bleeding   [facility] Gareth Mliss [place], [place]

## 2024-07-18 ENCOUNTER — Ambulatory Visit
Admission: RE | Admit: 2024-07-18 | Discharge: 2024-07-18 | Disposition: A | Discharge status: Home / Self Care | Payer: PRIVATE HEALTH INSURANCE | Source: Ambulatory Visit | Attending: Nurse Practitioner

## 2024-07-18 ENCOUNTER — Other Ambulatory Visit: Payer: Self-pay | Admitting: Nurse Practitioner

## 2024-07-18 ENCOUNTER — Ambulatory Visit
Admission: RE | Admit: 2024-07-18 | Discharge: 2024-07-18 | Disposition: A | Discharge status: Home / Self Care | Payer: PRIVATE HEALTH INSURANCE | Source: Ambulatory Visit | Attending: Nurse Practitioner | Admitting: Nurse Practitioner

## 2024-07-18 DIAGNOSIS — N6489 Other specified disorders of breast: Secondary | ICD-10-CM

## 2024-07-18 DIAGNOSIS — Z803 Family history of malignant neoplasm of breast: Secondary | ICD-10-CM | POA: Diagnosis present

## 2024-07-18 DIAGNOSIS — N644 Mastodynia: Secondary | ICD-10-CM | POA: Diagnosis present

## 2024-07-18 DIAGNOSIS — N6314 Unspecified lump in the right breast, lower inner quadrant: Secondary | ICD-10-CM | POA: Diagnosis present

## 2024-07-18 DIAGNOSIS — R928 Other abnormal and inconclusive findings on diagnostic imaging of breast: Secondary | ICD-10-CM

## 2024-07-23 ENCOUNTER — Ambulatory Visit
Admission: RE | Admit: 2024-07-23 | Discharge: 2024-07-23 | Disposition: A | Discharge status: Home / Self Care | Payer: PRIVATE HEALTH INSURANCE | Source: Ambulatory Visit | Attending: Nurse Practitioner | Admitting: Nurse Practitioner

## 2024-07-23 DIAGNOSIS — R928 Other abnormal and inconclusive findings on diagnostic imaging of breast: Secondary | ICD-10-CM | POA: Insufficient documentation

## 2024-07-23 DIAGNOSIS — N641 Fat necrosis of breast: Secondary | ICD-10-CM | POA: Diagnosis present

## 2024-07-23 DIAGNOSIS — Z803 Family history of malignant neoplasm of breast: Secondary | ICD-10-CM | POA: Diagnosis not present

## 2024-07-23 HISTORY — PX: BREAST BIOPSY: SHX20

## 2024-07-23 MED ORDER — LIDOCAINE-EPINEPHRINE 1 %-1:100000 IJ SOLN
5.0000 mL | Freq: Once | INTRAMUSCULAR | Status: AC
  Administered 2024-07-23: 5 mL
  Filled 2024-07-23: qty 5

## 2024-07-23 MED ORDER — LIDOCAINE 1 % OPTIME INJ - NO CHARGE
2.0000 mL | Freq: Once | INTRAMUSCULAR | Status: AC
  Administered 2024-07-23: 2 mL
  Filled 2024-07-23: qty 2

## 2024-07-24 LAB — SURGICAL PATHOLOGY
